# Patient Record
Sex: Male | Born: 1950 | Race: White | Hispanic: No | Marital: Married | Smoking: Never smoker
Health system: Southern US, Community
[De-identification: ages and names within clinical notes are randomized; demographics above are authoritative.]

## PROBLEM LIST (undated history)

## (undated) DIAGNOSIS — T7840XA Allergy, unspecified, initial encounter: Secondary | ICD-10-CM

## (undated) DIAGNOSIS — J189 Pneumonia, unspecified organism: Secondary | ICD-10-CM

## (undated) DIAGNOSIS — M722 Plantar fascial fibromatosis: Secondary | ICD-10-CM

## (undated) DIAGNOSIS — M1990 Unspecified osteoarthritis, unspecified site: Secondary | ICD-10-CM

## (undated) DIAGNOSIS — J309 Allergic rhinitis, unspecified: Secondary | ICD-10-CM

## (undated) DIAGNOSIS — K219 Gastro-esophageal reflux disease without esophagitis: Secondary | ICD-10-CM

## (undated) HISTORY — DX: Plantar fascial fibromatosis: M72.2

## (undated) HISTORY — DX: Allergy, unspecified, initial encounter: T78.40XA

## (undated) HISTORY — PX: APPENDECTOMY: SHX54

## (undated) HISTORY — PX: TONSILLECTOMY: SUR1361

## (undated) HISTORY — DX: Unspecified osteoarthritis, unspecified site: M19.90

## (undated) HISTORY — DX: Allergic rhinitis, unspecified: J30.9

---
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
## 2002-08-14 ENCOUNTER — Ambulatory Visit (HOSPITAL_COMMUNITY): Admission: RE | Admit: 2002-08-14 | Discharge: 2002-08-14 | Payer: Self-pay | Admitting: Gastroenterology

## 2006-05-08 ENCOUNTER — Encounter: Admission: RE | Admit: 2006-05-08 | Discharge: 2006-05-08 | Payer: Self-pay | Admitting: Sports Medicine

## 2010-02-20 ENCOUNTER — Emergency Department (HOSPITAL_COMMUNITY)
Admission: EM | Admit: 2010-02-20 | Discharge: 2010-02-20 | Payer: Self-pay | Source: Home / Self Care | Admitting: Emergency Medicine

## 2010-03-29 ENCOUNTER — Encounter: Payer: Self-pay | Admitting: Sports Medicine

## 2010-07-24 NOTE — Op Note (Signed)
   Maurice [name], Maurice [name]                              ACCOUNT NO.:  [id number]   MEDICAL RECORD NO.:  [id number]                   PATIENT TYPE:  AMB   LOCATION:  ENDO                                 FACILITY:  [facility]   PHYSICIAN:  John C. Madilyn Fireman, M.D.                 DATE OF BIRTH:  05-23-1950   DATE OF PROCEDURE:  08/14/2002  DATE OF DISCHARGE:                                 OPERATIVE REPORT   PROCEDURE:  Colonoscopy.   INDICATIONS FOR PROCEDURE:  Family history of colon polyps in a first-degree  relative.   DESCRIPTION OF PROCEDURE:  The patient was placed in the left lateral  decubitus position and placed on the pulse monitor with continuous low flow  oxygen delivered by nasal cannula.  He was sedated with 50 mcg IV fentanyl,  6 mg IV Versed.  The Olympus video colonoscope was inserted into the rectum  and advanced to the cecum, confirmed by transillumination of McBurney's  point and visualization of the ileocecal valve and appendiceal orifice.  Prep was excellent.  The cecum, ascending, transverse, descending, and  sigmoid colon all appeared normal with no masses, polyps, diverticula, or  other mucosal abnormalities.  The rectum likewise appeared normal and  retroflexed view of the anus revealed no obvious internal hemorrhoids.  The  colonoscope was then withdrawn and the patient returned to the recovery room  in stable condition.  He tolerated the procedure well and there were no  immediate complications.   IMPRESSION:  Normal colonoscopy.   PLAN:  Repeat study in five years.                                                John C. Madilyn Fireman, M.D.    JCH/MEDQ  D:  08/14/2002  T:  08/14/2002  Job:  [id number]

## 2012-09-22 ENCOUNTER — Ambulatory Visit (INDEPENDENT_AMBULATORY_CARE_PROVIDER_SITE_OTHER): Payer: BC Managed Care – PPO | Admitting: Family Medicine

## 2012-09-22 VITALS — BP 110/70 | HR 81 | Temp 98.5°F | Resp 18 | Ht 69.0 in | Wt 194.0 lb

## 2012-09-22 DIAGNOSIS — R05 Cough: Secondary | ICD-10-CM

## 2012-09-22 DIAGNOSIS — J209 Acute bronchitis, unspecified: Secondary | ICD-10-CM

## 2012-09-22 MED ORDER — HYDROCODONE-HOMATROPINE 5-1.5 MG/5ML PO SYRP: 5.0000 mL | ORAL_SOLUTION | Freq: Three times a day (TID) | ORAL | Status: DC | PRN

## 2012-09-22 MED ORDER — AZITHROMYCIN 250 MG PO TABS: ORAL_TABLET | ORAL | Status: DC

## 2012-09-22 NOTE — Progress Notes (Signed)
[facility] [address] [place] [address] [phone]  Date:  09/22/2012   Name:  Maurice [name]   DOB:  [birth year]   MRN:  [id number]  PCP:  No PCP Per Patient    Chief Complaint: chest cold with cough started sunday   History of Present Illness:  Maurice [name] is a 62 y.o. very pleasant male patient who presents with the following:   Patient presents today and says that he has a cold. He states that about 5 days ago he started with a sore throat. The next day he started having a runny nose and some post nasal drainage which has been persistent. He denies having any fever. A cough manifested last night. The cough is productive with green mucous. He denies sickness of anyone else in the household. Patient states that he was feeling run down and this initiated the onset of the cold. Patient mentions that he has a history of chronic rhinitis and pneumonia x3.   Denies headache or ear pain. Took some sudafed but this caused difficulty urinating.  He stopped the sudafed and this problem has resolved.   He would like to do a physical soon  There are no active problems to display for this patient.   Past Medical History  Diagnosis Date  . Allergy   . Arthritis     History reviewed. No pertinent past surgical history.  History  Substance Use Topics  . Smoking status: Never Smoker   . Smokeless tobacco: Not on file  . Alcohol Use: No    Family History  Problem Relation Age of Onset  . Arthritis Father   . Cancer Father     No Known Allergies  Medication list has been reviewed and updated.  No current outpatient prescriptions on file prior to visit.   No current facility-administered medications on file prior to visit.    Review of Systems:  As per HPI- otherwise negative. No fever.      Physical Examination: Filed Vitals:   09/22/12 1425  BP: 110/70  Pulse: 81  Temp: 98.5 F (36.9 C)  Resp: 18   Filed Vitals:   09/22/12 1425   Height: 5\' 9"  (1.753 m)  Weight: 194 lb (87.998 kg)   Body mass index is 28.64 kg/(m^2). Ideal Body Weight: Weight in (lb) to have BMI = 25: 168.9  GEN: WDWN, NAD, Non-toxic, A & O x 3, looks well HEENT: Atraumatic, Normocephalic. Neck supple. No masses, No LAD.  Bilateral TM wnl, oropharynx normal.  PEERL,EOMI.   Ears and Nose: No external deformity. CV: RRR, No M/G/R. No JVD. No thrill. No extra heart sounds. PULM: slight chest congestion but no wheezing. No retractions. No resp. distress. No accessory muscle use. ABD: S, NT, ND, +BS. No rebound. No HSM. EXTR: No c/c/e NEURO Normal gait.  PSYCH: Normally interactive. Conversant. Not depressed or anxious appearing.  Calm demeanor.    Assessment and Plan: Acute bronchitis - Plan: azithromycin (ZITHROMAX) 250 MG tablet  Cough - Plan: HYDROcodone-homatropine (HYCODAN) 5-1.5 MG/5ML syrup  Treat with azithromycin for bronchitis, and hycodan as needed for cough.  Let me know if not better in the next few days- Sooner if worse.     Signed Abbe Amsterdam, MD

## 2012-09-22 NOTE — Patient Instructions (Addendum)
Use the azithromycin antibiotic as directed, and the cough syrup as needed. Remember the cough syrup can make you sleepy, so do not use it when you need to drive.

## 2012-09-25 ENCOUNTER — Ambulatory Visit (INDEPENDENT_AMBULATORY_CARE_PROVIDER_SITE_OTHER): Payer: BC Managed Care – PPO | Admitting: Family Medicine

## 2012-09-25 ENCOUNTER — Ambulatory Visit: Payer: BC Managed Care – PPO

## 2012-09-25 VITALS — BP 110/70 | HR 70 | Temp 98.4°F | Resp 16 | Ht 69.0 in | Wt 194.0 lb

## 2012-09-25 DIAGNOSIS — M19039 Primary osteoarthritis, unspecified wrist: Secondary | ICD-10-CM

## 2012-09-25 DIAGNOSIS — M25532 Pain in left wrist: Secondary | ICD-10-CM

## 2012-09-25 DIAGNOSIS — M25539 Pain in unspecified wrist: Secondary | ICD-10-CM

## 2012-09-25 LAB — URIC ACID: Uric Acid, Serum: 5.7 mg/dL (ref 4.0–7.8)

## 2012-09-25 MED ORDER — DICLOFENAC SODIUM 75 MG PO TBEC: 75.0000 mg | DELAYED_RELEASE_TABLET | Freq: Two times a day (BID) | ORAL | Status: DC

## 2012-09-25 NOTE — Patient Instructions (Addendum)
Take Voltaren (diclofenac) one twice daily for pain and inflammation  Return if worse  If symptoms persist we can refer you to an orthopedist.

## 2012-09-25 NOTE — Progress Notes (Signed)
Subjective: Patient attached the coasters onto a chair a couple days ago. His wrist develop severe pain in it. He had a fracture of the navicular when he was young.  Objective: Very painful and limited range of motion left wrist. He is left-handed.  Assessment: Left wrist pain  Plan: X-ray  [facility] reading (PRIMARY) by  Dr. Alwyn Ren Arthritic joint  Uric acid  If the uric acid is high Will treated for gout. Did go ahead and give him) for the arthritic wrists. May have to refer to an orthopedist if symptoms persist..

## 2015-09-17 ENCOUNTER — Other Ambulatory Visit: Payer: Self-pay

## 2015-10-02 ENCOUNTER — Other Ambulatory Visit: Payer: Self-pay | Admitting: Allergy and Immunology

## 2015-10-02 NOTE — Telephone Encounter (Addendum)
Pt called and made appointment for aug 22 and needs [place] to call in rx for montelukast and fluticasone , astelin.  [place]

## 2015-10-03 ENCOUNTER — Other Ambulatory Visit: Payer: Self-pay

## 2015-10-03 MED ORDER — MONTELUKAST SODIUM 10 MG PO TABS: 10.0000 mg | ORAL_TABLET | Freq: Every day | ORAL | 0 refills | Status: DC

## 2015-10-03 MED ORDER — AZELASTINE HCL 0.1 % NA SOLN: 1.0000 | Freq: Two times a day (BID) | NASAL | 0 refills | Status: DC

## 2015-10-03 MED ORDER — FLUTICASONE PROPIONATE 50 MCG/ACT NA SUSP: 1.0000 | Freq: Every day | NASAL | 0 refills | Status: DC

## 2015-10-03 NOTE — Telephone Encounter (Signed)
Sent script into [facility]. Patient informed.

## 2015-10-28 ENCOUNTER — Ambulatory Visit (INDEPENDENT_AMBULATORY_CARE_PROVIDER_SITE_OTHER): Payer: Federal, State, Local not specified - PPO | Admitting: Allergy and Immunology

## 2015-10-28 ENCOUNTER — Encounter: Payer: Self-pay | Admitting: Allergy and Immunology

## 2015-10-28 ENCOUNTER — Encounter (INDEPENDENT_AMBULATORY_CARE_PROVIDER_SITE_OTHER): Payer: Self-pay

## 2015-10-28 VITALS — BP 136/82 | HR 64 | Resp 16 | Ht 67.3 in | Wt 190.0 lb

## 2015-10-28 DIAGNOSIS — J310 Chronic rhinitis: Secondary | ICD-10-CM | POA: Diagnosis not present

## 2015-10-28 DIAGNOSIS — J302 Other seasonal allergic rhinitis: Secondary | ICD-10-CM

## 2015-10-28 DIAGNOSIS — H1045 Other chronic allergic conjunctivitis: Secondary | ICD-10-CM | POA: Diagnosis not present

## 2015-10-28 DIAGNOSIS — T485X5A Adverse effect of other anti-common-cold drugs, initial encounter: Secondary | ICD-10-CM

## 2015-10-28 DIAGNOSIS — H1010 Acute atopic conjunctivitis, unspecified eye: Secondary | ICD-10-CM

## 2015-10-28 DIAGNOSIS — J309 Allergic rhinitis, unspecified: Principal | ICD-10-CM

## 2015-10-28 NOTE — Patient Instructions (Addendum)
  1. Continue Flonase one spray each nostril twice a day  2. Continue Astelin one spray each nostril twice a day  3. Continue montelukast 10 mg daily  4. Use least amount of Afrin possible  5. Obtain fall flu vaccine  6. Return to clinic in 1 year or earlier if problem 

## 2015-10-28 NOTE — Progress Notes (Signed)
Follow-up Note  Referring Provider: No ref. provider found Primary Provider: Anthoney Harada, MD Date of Office Visit: 10/28/2015  Subjective:   Maurice [name] (DOB: May 25, 1950) is a 65 y.o. male who returns to the [facility] on 10/28/2015 in re-evaluation of the following:  HPI: Maurice [name] returns to this clinic in reevaluation of his allergic rhinoconjunctivitis and rhinitis medicamentosa. I've not seen him in this clinic in approximately one year.  During the interval he has done very well utilizing a plan of fluticasone and Astelin and montelukast and Afrin. He will use Afrin about 3 times a week usually only at nighttime. He does not appear to develop any type of rebound phenomena but he does need to use Afrin a few times a week just to keep his nose open while he sleeps. There is no real rhyme or reason why his nose gets congested on some nights and does not get congested on others. Overall he is very pleased with the response that he has received utilizing this therapy.    Medication List      azelastine 0.1 % nasal spray Commonly known as:  ASTELIN Place 1 spray into both nostrils 2 (two) times daily.   fluticasone 50 MCG/ACT nasal spray Commonly known as:  FLONASE Place 1-2 sprays into both nostrils daily.   montelukast 10 MG tablet Commonly known as:  SINGULAIR Take 1 tablet (10 mg total) by mouth at bedtime.   oxymetazoline 0.05 % nasal spray Commonly known as:  AFRIN Place 2 sprays into the nose 2 (two) times daily.       Past Medical History:  Diagnosis Date  . Allergic rhinitis   . Allergy   . Arthritis     Past Surgical History:  Procedure Laterality Date  . APPENDECTOMY    . TONSILLECTOMY      No Known Allergies  Review of systems negative except as noted in HPI / PMHx or noted below:  Review of Systems  Constitutional: Negative.   HENT: Negative.   Eyes: Negative.   Respiratory: Negative.   Cardiovascular: Negative.     Gastrointestinal: Negative.   Genitourinary: Negative.   Musculoskeletal: Negative.   Skin: Negative.   Neurological: Negative.   Endo/Heme/Allergies: Negative.   Psychiatric/Behavioral: Negative.      Objective:   Vitals:   10/28/15 1344  BP: 136/82  Pulse: 64  Resp: 16   Height: 5' 7.3" (170.9 cm)  Weight: 190 lb (86.2 kg)   Physical Exam  Constitutional: He is well-developed, well-nourished, and in no distress.  HENT:  Head: Normocephalic.  Right Ear: Tympanic membrane, external ear and ear canal normal.  Left Ear: Tympanic membrane, external ear and ear canal normal.  Nose: Nose normal. No mucosal edema or rhinorrhea.  Mouth/Throat: Uvula is midline, oropharynx is clear and moist and mucous membranes are normal. No oropharyngeal exudate.  Eyes: Conjunctivae are normal.  Neck: Trachea normal. No tracheal tenderness present. No tracheal deviation present. No thyromegaly present.  Cardiovascular: Normal rate, regular rhythm, S1 normal, S2 normal and normal heart sounds.   No murmur heard. Pulmonary/Chest: Breath sounds normal. No stridor. No respiratory distress. He has no wheezes. He has no rales.  Musculoskeletal: He exhibits no edema.  Lymphadenopathy:       Head (right side): No tonsillar adenopathy present.       Head (left side): No tonsillar adenopathy present.    He has no cervical adenopathy.  Neurological: He is alert. Gait normal.  Skin:  No rash noted. He is not diaphoretic. No erythema. Nails show no clubbing.  Psychiatric: Mood and affect normal.    Diagnostics: None   Assessment and Plan:   1. Allergic rhinoconjunctivitis   2. Rhinitis medicamentosa     1. Continue Flonase one spray each nostril twice a day  2. Continue Astelin one spray each nostril twice a day  3. Continue montelukast 10 mg daily  4. Use least amount of Afrin possible  5. Obtain fall flu vaccine  6. Return to clinic in 1 year or earlier if problem  Maurice [name] is doing quite  well on his current medical plan and I see no need for him changing this plan at this point in time. He still continues to use Afrin a few times per week but as long as he also uses nasal fluticasone I think he will do relatively well and we'll not get into the whole issue of rebound phenomena. I'll see him back in this clinic in approximately one year or earlier if there is a problem.  Allena Katz, MD [place]

## 2015-11-18 ENCOUNTER — Other Ambulatory Visit: Payer: Self-pay | Admitting: Allergy and Immunology

## 2016-03-24 ENCOUNTER — Encounter (HOSPITAL_COMMUNITY): Payer: Self-pay

## 2016-03-24 ENCOUNTER — Emergency Department (HOSPITAL_COMMUNITY)
Admission: EM | Admit: 2016-03-24 | Discharge: 2016-03-24 | Disposition: A | Discharge status: Home / Self Care | Payer: Federal, State, Local not specified - PPO | Attending: Emergency Medicine | Admitting: Emergency Medicine

## 2016-03-24 DIAGNOSIS — K644 Residual hemorrhoidal skin tags: Secondary | ICD-10-CM | POA: Diagnosis not present

## 2016-03-24 DIAGNOSIS — K6289 Other specified diseases of anus and rectum: Secondary | ICD-10-CM | POA: Diagnosis present

## 2016-03-24 DIAGNOSIS — Z79899 Other long term (current) drug therapy: Secondary | ICD-10-CM | POA: Diagnosis not present

## 2016-03-24 LAB — CBC WITH DIFFERENTIAL/PLATELET
BASOS ABS: 0 10*3/uL (ref 0.0–0.1)
BASOS PCT: 0 %
EOS ABS: 0.3 10*3/uL (ref 0.0–0.7)
EOS PCT: 3 %
HCT: 46.1 % (ref 39.0–52.0)
HEMOGLOBIN: 16 g/dL (ref 13.0–17.0)
Lymphocytes Relative: 18 %
Lymphs Abs: 1.7 10*3/uL (ref 0.7–4.0)
MCH: 31.9 pg (ref 26.0–34.0)
MCHC: 34.7 g/dL (ref 30.0–36.0)
MCV: 91.8 fL (ref 78.0–100.0)
Monocytes Absolute: 1 10*3/uL (ref 0.1–1.0)
Monocytes Relative: 10 %
NEUTROS PCT: 69 %
Neutro Abs: 6.4 10*3/uL (ref 1.7–7.7)
PLATELETS: 236 10*3/uL (ref 150–400)
RBC: 5.02 MIL/uL (ref 4.22–5.81)
RDW: 12.7 % (ref 11.5–15.5)
WBC: 9.4 10*3/uL (ref 4.0–10.5)

## 2016-03-24 LAB — BASIC METABOLIC PANEL
Anion gap: 10 (ref 5–15)
BUN: 20 mg/dL (ref 6–20)
CALCIUM: 9 mg/dL (ref 8.9–10.3)
CHLORIDE: 104 mmol/L (ref 101–111)
CO2: 22 mmol/L (ref 22–32)
CREATININE: 0.88 mg/dL (ref 0.61–1.24)
Glucose, Bld: 101 mg/dL — ABNORMAL HIGH (ref 65–99)
Potassium: 4.1 mmol/L (ref 3.5–5.1)
SODIUM: 136 mmol/L (ref 135–145)

## 2016-03-24 MED ORDER — HYDROCODONE-ACETAMINOPHEN 5-325 MG PO TABS: 1.0000 | ORAL_TABLET | ORAL | 0 refills | Status: DC | PRN

## 2016-03-24 MED ORDER — BENZONATATE 100 MG PO CAPS
100.0000 mg | ORAL_CAPSULE | Freq: Once | ORAL | Status: AC
  Administered 2016-03-24: 100 mg via ORAL
  Filled 2016-03-24: qty 1

## 2016-03-24 MED ORDER — IOPAMIDOL (ISOVUE-300) INJECTION 61%
INTRAVENOUS | Status: AC
  Administered 2016-03-24: 30 mL via ORAL
  Filled 2016-03-24: qty 30

## 2016-03-24 MED ORDER — POLYETHYLENE GLYCOL 3350 17 G PO PACK: 17.0000 g | PACK | Freq: Three times a day (TID) | ORAL | 0 refills | Status: DC

## 2016-03-24 MED ORDER — IOPAMIDOL (ISOVUE-300) INJECTION 61%
30.0000 mL | Freq: Once | INTRAVENOUS | Status: AC | PRN
  Administered 2016-03-24: 30 mL via ORAL

## 2016-03-24 NOTE — ED Triage Notes (Signed)
Pt has prolapsed rectum. Was seen by Md yesterday.  Pt states pain is getting worse.  Told by MD to come here for surgical eval.

## 2016-03-24 NOTE — ED Notes (Signed)
Unable to collect labs at this time xray is in the room

## 2016-03-24 NOTE — ED Provider Notes (Signed)
[facility] DEPT Provider Note   CSN: [id number] Arrival date & time: 03/24/16  [id number]     History   Chief Complaint Chief Complaint  Patient presents with  . Rectal Pain    HPI Maurice [name] is a 66 y.o. male.  HPI   Patient presents with rectal pain and hemorrhoid prolapse x 3 days after straining with constipation.  Was sent by PCP concerned for possible prolopsed rectum, thrombosed hemorrhoids, or rectal abscess.  Was seen by PCP yesterday and referred to general surgery but surgery office is closed today.  Yesterday was also diagnosed with bronchitis.  Prescribed anusol, nupericain, z-pak, chertussin. Had difficulty using suppository due to the large amount of swelling around his rectum.  Denies fevers, abdominal pain, change in bowel habits.    Past Medical History:  Diagnosis Date  . Allergic rhinitis   . Allergy   . Arthritis     There are no active problems to display for this patient.   Past Surgical History:  Procedure Laterality Date  . APPENDECTOMY    . TONSILLECTOMY         Home Medications    Prior to Admission medications   Medication Sig Start Date End Date Taking? Authorizing Provider  acetaminophen (TYLENOL) 500 MG tablet Take 1,000 mg by mouth every 6 (six) hours as needed for mild pain, moderate pain, fever or headache.   Yes Historical Provider, MD  azelastine (ASTELIN) 0.1 % nasal spray USE 1 SPRAY IN EACH NOSTRIL TWICE DAILY 11/18/15  Yes Jiles Prows, MD  azithromycin (ZITHROMAX) 250 MG tablet Take 250-500 mg by mouth daily. Started 01/16 for 5 days Take 500mg  on day 1, then 250mg  daily for 4 days   Yes Historical Provider, MD  dibucaine (NUPERCAINAL) 1 % ointment Apply 1 application topically 3 (three) times daily as needed for pain.   Yes Historical Provider, MD  fluticasone (FLONASE) 50 MCG/ACT nasal spray SHAKE LIQUID AND USE 1 TO 2 SPRAYS IN EACH NOSTRIL DAILY 11/18/15  Yes Jiles Prows, MD  guaiFENesin-codeine [facility]) 100-10  MG/5ML syrup Take 5 mLs by mouth every 6 (six) hours as needed for cough.   Yes Historical Provider, MD  hydrocortisone (ANUSOL-HC) 25 MG suppository Place 25 mg rectally 2 (two) times daily as needed for hemorrhoids or itching.   Yes Historical Provider, MD  montelukast (SINGULAIR) 10 MG tablet TAKE 1 TABLET(10 MG) BY MOUTH AT BEDTIME 11/18/15  Yes Jiles Prows, MD  oxymetazoline (AFRIN) 0.05 % nasal spray Place 3 sprays into the nose 2 (two) times daily as needed for congestion.    Yes Historical Provider, MD  tadalafil (CIALIS) 20 MG tablet Take 20 mg by mouth daily as needed for erectile dysfunction.   Yes Historical Provider, MD  HYDROcodone-acetaminophen (NORCO/VICODIN) 5-325 MG tablet Take 1-2 tablets by mouth every 4 (four) hours as needed for moderate pain or severe pain. 03/24/16   Clayton Bibles, PA-C  polyethylene glycol (MIRALAX / GLYCOLAX) packet Take 17 g by mouth 3 (three) times daily. 03/24/16   Clayton Bibles, PA-C    Family History Family History  Problem Relation Age of Onset  . Arthritis Father   . Cancer Father   . Allergic rhinitis Father   . Asthma Father     Social History Social History  Substance Use Topics  . Smoking status: Never Smoker  . Smokeless tobacco: Never Used  . Alcohol use No     Allergies   Patient has no known allergies.  Review of Systems Review of Systems  All other systems reviewed and are negative.    Physical Exam Updated Vital Signs BP 140/86 (BP Location: Left Arm)   Pulse 71   Temp 98.2 F (36.8 C) (Oral)   Resp 16   SpO2 99%   Physical Exam  Constitutional: He appears well-developed and well-nourished. No distress.  HENT:  Head: Normocephalic and atraumatic.  Neck: Neck supple.  Cardiovascular: Normal rate and regular rhythm.   Pulmonary/Chest: Effort normal and breath sounds normal. No respiratory distress. He has no wheezes. He has no rales.  Abdominal: Soft. He exhibits no distension and no mass. There is no tenderness.  There is no rebound and no guarding.  Genitourinary:  Genitourinary Comments: Two large protruding mucosal covered masses, tender to palpation.  No bleeding.  Unable to perform full rectal exam secondary to pain and large protruding masses.    Neurological: He is alert. He exhibits normal muscle tone.  Skin: He is not diaphoretic.  Nursing note and vitals reviewed.    ED Treatments / Results  Labs (all labs ordered are listed, but only abnormal results are displayed) Labs Reviewed  BASIC METABOLIC PANEL - Abnormal; Notable for the following:       Result Value   Glucose, Bld 101 (*)    All other components within normal limits  CBC WITH DIFFERENTIAL/PLATELET    EKG  EKG Interpretation None       Radiology No results found.  Procedures Procedures (including critical care time)  Medications Ordered in ED Medications  benzonatate (TESSALON) capsule 100 mg (100 mg Oral Given 03/24/16 1247)  iopamidol (ISOVUE-300) 61 % injection 30 mL (30 mLs Oral Contrast Given 03/24/16 1132)     Initial Impression / Assessment and Plan / ED Course  I have reviewed the triage vital signs and the nursing notes.  Pertinent labs & imaging results that were available during my care of the patient were reviewed by me and considered in my medical decision making (see chart for details).  Clinical Course as of Mar 24 1314  Wed Mar 24, 2016  1249 Patient also seen and examined by Dr Eulis Foster who diagnoses external hemorrhoids.  Will cancel CT but continue bloodwork.  Plan for d/c home with Miralax TID, norco, sitz baths.    [EW]    Clinical Course User Index [EW] Clayton Bibles, PA-C   Afebrile, nontoxic patient with painful external hemorrhoid.  Hemorrhoids are not thrombosed.  No e/o rectal prolapse.  Doubt rectal abscess.   D/C home with symptomatic treatment, surgical referral.  Discussed result, findings, treatment, and follow up  with patient.  Pt given return precautions.  Pt verbalizes  understanding and agrees with plan.       Final Clinical Impressions(s) / ED Diagnoses   Final diagnoses:  External hemorrhoid    New Prescriptions New Prescriptions   HYDROCODONE-ACETAMINOPHEN (NORCO/VICODIN) 5-325 MG TABLET    Take 1-2 tablets by mouth every 4 (four) hours as needed for moderate pain or severe pain.   POLYETHYLENE GLYCOL (MIRALAX / GLYCOLAX) PACKET    Take 17 g by mouth 3 (three) times daily.     Clayton Bibles, PA-C 03/24/16 [place], MD 03/25/16 [place], MD 03/25/16 2223

## 2016-03-24 NOTE — ED Provider Notes (Signed)
  Face-to-face evaluation   History: Patient is here for painful bumps on the anus, history same. Seen by PCP who advised that he might have prolapsed rectum or prolapsed internal hemorrhoids. He is using hydrocortisone suppositories with partial relief. He denies abdominal pain, weakness or dizziness.  Physical exam: Anus Exam- 2 relatively large posterior aspect external hemorrhoids which are partially reducible with light pressure. These do not appear thrombosed. There is no clinical evidence for prolapsed rectum.  -Medical decision-making- painful hemorrhoids, with appearance of external anatomy. No indication for incision, hospitalization or urgent surgical repair.  Medical screening examination/treatment/procedure(s) were conducted as a shared visit with non-physician practitioner(s) and myself.  I personally evaluated the patient during the encounter   Daleen Bo, MD 03/25/16 2224

## 2016-03-24 NOTE — Discharge Instructions (Signed)
Read the information below.  Use the prescribed medication as directed.  Please discuss all new medications with your pharmacist.  You may return to the Emergency Department at any time for worsening condition or any new symptoms that concern you.    If you develop fevers, uncontrolled pain, inability to pass gas or defecate, please return to the Emergency Department for a recheck.

## 2016-03-30 ENCOUNTER — Other Ambulatory Visit: Payer: Self-pay | Admitting: Surgery

## 2016-05-11 ENCOUNTER — Telehealth: Payer: Self-pay

## 2016-05-11 NOTE — Telephone Encounter (Signed)
NOTES SENT TO SCHEDULING.  °

## 2016-05-27 DIAGNOSIS — R002 Palpitations: Secondary | ICD-10-CM | POA: Insufficient documentation

## 2016-05-31 ENCOUNTER — Ambulatory Visit (INDEPENDENT_AMBULATORY_CARE_PROVIDER_SITE_OTHER): Payer: Federal, State, Local not specified - PPO

## 2016-05-31 DIAGNOSIS — R002 Palpitations: Secondary | ICD-10-CM

## 2016-12-07 ENCOUNTER — Ambulatory Visit (INDEPENDENT_AMBULATORY_CARE_PROVIDER_SITE_OTHER): Payer: Federal, State, Local not specified - PPO

## 2016-12-07 ENCOUNTER — Ambulatory Visit (INDEPENDENT_AMBULATORY_CARE_PROVIDER_SITE_OTHER): Payer: Federal, State, Local not specified - PPO | Admitting: Podiatry

## 2016-12-07 ENCOUNTER — Encounter: Payer: Self-pay | Admitting: Podiatry

## 2016-12-07 VITALS — BP 117/82 | HR 70 | Resp 16

## 2016-12-07 DIAGNOSIS — M2012 Hallux valgus (acquired), left foot: Secondary | ICD-10-CM

## 2016-12-07 DIAGNOSIS — M722 Plantar fascial fibromatosis: Secondary | ICD-10-CM | POA: Diagnosis not present

## 2016-12-07 MED ORDER — METHYLPREDNISOLONE 4 MG PO TBPK: ORAL_TABLET | ORAL | 0 refills | Status: DC

## 2016-12-07 MED ORDER — MELOXICAM 15 MG PO TABS: 15.0000 mg | ORAL_TABLET | Freq: Every day | ORAL | 3 refills | Status: DC

## 2016-12-07 NOTE — Patient Instructions (Signed)

## 2016-12-07 NOTE — Progress Notes (Signed)
  Subjective:  Patient ID: Maurice [name], male    DOB: [birth year],  MRN: [id number] HPI Chief Complaint  Patient presents with  . Foot Pain    Plantar heel and lateral left - aching x 6 weeks, AM pain, no treatment    66 y.o. male presents with the above complaint.     Past Medical History:  Diagnosis Date  . Allergic rhinitis   . Allergy   . Arthritis    Past Surgical History:  Procedure Laterality Date  . APPENDECTOMY    . TONSILLECTOMY      Current Outpatient Prescriptions:  .  azelastine (ASTELIN) 0.1 % nasal spray, USE 1 SPRAY IN EACH NOSTRIL TWICE DAILY, Disp: 30 mL, Rfl: 9 .  fluticasone (FLONASE) 50 MCG/ACT nasal spray, SHAKE LIQUID AND USE 1 TO 2 SPRAYS IN EACH NOSTRIL DAILY, Disp: 16 g, Rfl: 9 .  montelukast (SINGULAIR) 10 MG tablet, TAKE 1 TABLET(10 MG) BY MOUTH AT BEDTIME, Disp: 30 tablet, Rfl: 9 .  tadalafil (CIALIS) 20 MG tablet, Take 20 mg by mouth daily as needed for erectile dysfunction., Disp: , Rfl:   No Known Allergies Review of Systems  Musculoskeletal: Positive for arthralgias.  All other systems reviewed and are negative.  Objective:   Vitals:   12/07/16 0912  BP: 117/82  Pulse: 70  Resp: 16    General: Well developed, nourished, in no acute distress, alert and oriented x3   Dermatological: Skin is warm, dry and supple bilateral. Nails x 10 are well maintained; remaining integument appears unremarkable at this time. There are no open sores, no preulcerative lesions, no rash or signs of infection present.  Vascular: Dorsalis Pedis artery and Posterior Tibial artery pedal pulses are 2/4 bilateral with immedate capillary fill time. Pedal hair growth present. No varicosities and no lower extremity edema present bilateral.   Neruologic: Grossly intact via light touch bilateral. Vibratory intact via tuning fork bilateral. Protective threshold with Semmes Wienstein monofilament intact to all pedal sites bilateral. Patellar and Achilles deep tendon  reflexes 2+ bilateral. No Babinski or clonus noted bilateral.   Musculoskeletal: No gross boney pedal deformities bilateral. No pain, crepitus, or limitation noted with foot and ankle range of motion bilateral. Muscular strength 5/5 in all groups tested bilateral. Pain on palpation medial calcaneal tubercle of the left heel. No pain on medial and lateral compression of the calcaneus. No reproducible pain to the plantar lateral aspect of the foot.  Gait: Unassisted, Nonantalgic.    Radiographs:  3 views left foot demonstrates osseously mature individual. Soft tissue increasing density at the plantar fascial calcaneal insertion site. No spurs noted.  Assessment & Plan:   Assessment: Plantar fasciitis left.  Plan: We discussed the etiology pathology conservative versus surgical therapies. At this point we started a Medrol Dosepak to be followed by meloxicam. Injected his left heel placed in the plantar fascial brace and a night splint. Discussed appropriate shoe gear stretching exercises ice therapy and shoe gear modifications. I will follow-up with him in 1 month.     Max T. Dibble, [place]

## 2016-12-08 ENCOUNTER — Telehealth: Payer: Self-pay | Admitting: *Deleted

## 2016-12-08 NOTE — Telephone Encounter (Signed)
Pt presented to office stating he is unable to put on plantar fascial brace. Pt states he has been putting the broad band around his arch. I instructed pt to place the broad band with the "sun" at the back of the ankle with the cutout arch over the heel, and snuggle fasten the 2 velcro straps to the band, then place the elastic velcro strap on the outer side of the broad band and pull beneath the arch and attach to the medial side of the broad band. I instructed the pt that he could move the strap forward or backward, tighten or loosen for his comfort, that if it as comfortable it was doing it's job. Pt states understanding.

## 2016-12-13 ENCOUNTER — Other Ambulatory Visit: Payer: Self-pay | Admitting: Allergy and Immunology

## 2016-12-21 ENCOUNTER — Ambulatory Visit (INDEPENDENT_AMBULATORY_CARE_PROVIDER_SITE_OTHER): Payer: Federal, State, Local not specified - PPO | Admitting: Allergy and Immunology

## 2016-12-21 ENCOUNTER — Encounter: Payer: Self-pay | Admitting: Allergy and Immunology

## 2016-12-21 VITALS — BP 110/74 | HR 68 | Resp 16

## 2016-12-21 DIAGNOSIS — J310 Chronic rhinitis: Secondary | ICD-10-CM

## 2016-12-21 DIAGNOSIS — T485X5A Adverse effect of other anti-common-cold drugs, initial encounter: Secondary | ICD-10-CM

## 2016-12-21 DIAGNOSIS — T485X1A Poisoning by other anti-common-cold drugs, accidental (unintentional), initial encounter: Secondary | ICD-10-CM

## 2016-12-21 DIAGNOSIS — J3089 Other allergic rhinitis: Secondary | ICD-10-CM

## 2016-12-21 MED ORDER — FLUTICASONE PROPIONATE 50 MCG/ACT NA SUSP: NASAL | 11 refills | Status: DC

## 2016-12-21 MED ORDER — AZELASTINE HCL 0.1 % NA SOLN: NASAL | 11 refills | Status: DC

## 2016-12-21 MED ORDER — MONTELUKAST SODIUM 10 MG PO TABS: ORAL_TABLET | ORAL | 11 refills | Status: DC

## 2016-12-21 NOTE — Progress Notes (Signed)
Follow-up Note  Referring Provider: Vernie Shanks, MD Primary Provider: Vernie Shanks, MD Date of Office Visit: 12/21/2016  Subjective:   Maurice [name] (DOB: 16-Feb-1951) is a 66 y.o. male who returns to the [facility] [place] on 12/21/2016 in re-evaluation of the following:  HPI: Maurice [name] returns to this clinic in reevaluation of his allergic rhinoconjunctivitis and rhinitis medicamentosa. His last visit to this clinic was August 2017.  He has really done very well as long as he continues to use his medical plan which includes Flonase and Astelin and montelukast. He still continues to use Afrin most nights but does not develop a rebound phenomenon during the daytime as long as he continues on his 3 medications. Unfortunately, for the past 3-4 weeks he has been out of these medications and has developed significant nasal congestion throughout the day. He has not required a systemic steroid or an antibiotic to treat any type of respiratory tract issue since I have last seen him in this clinic.  Allergies as of 12/21/2016   No Known Allergies     Medication List      oxymetazoline 0.05 % nasal spray Commonly known as:  AFRIN Place 1 spray into both nostrils as needed for congestion.   SILDENAFIL CITRATE PO Take by mouth as needed.       Past Medical History:  Diagnosis Date  . Allergic rhinitis   . Allergy   . Arthritis   . Plantar fasciitis, left     Past Surgical History:  Procedure Laterality Date  . APPENDECTOMY    . TONSILLECTOMY      Review of systems negative except as noted in HPI / PMHx or noted below:  Review of Systems  Constitutional: Negative.   HENT: Negative.   Eyes: Negative.   Respiratory: Negative.   Cardiovascular: Negative.   Gastrointestinal: Negative.   Genitourinary: Negative.   Musculoskeletal: Negative.   Skin: Negative.   Neurological: Negative.   Endo/Heme/Allergies: Negative.   Psychiatric/Behavioral: Negative.       Objective:   Vitals:   12/21/16 1026  BP: 110/74  Pulse: 68  Resp: 16          Physical Exam  Constitutional: He is well-developed, well-nourished, and in no distress.  HENT:  Head: Normocephalic.  Right Ear: Tympanic membrane, external ear and ear canal normal.  Left Ear: Tympanic membrane, external ear and ear canal normal.  Nose: Nose normal. No mucosal edema or rhinorrhea.  Mouth/Throat: Uvula is midline, oropharynx is clear and moist and mucous membranes are normal. No oropharyngeal exudate.  Eyes: Conjunctivae are normal.  Neck: Trachea normal. No tracheal tenderness present. No tracheal deviation present. No thyromegaly present.  Cardiovascular: Normal rate, regular rhythm, S1 normal, S2 normal and normal heart sounds.   No murmur heard. Pulmonary/Chest: Breath sounds normal. No stridor. No respiratory distress. He has no wheezes. He has no rales.  Musculoskeletal: He exhibits no edema.  Lymphadenopathy:       Head (right side): No tonsillar adenopathy present.       Head (left side): No tonsillar adenopathy present.    He has no cervical adenopathy.  Neurological: He is alert. Gait normal.  Skin: No rash noted. He is not diaphoretic. No erythema. Nails show no clubbing.  Psychiatric: Mood and affect normal.    Diagnostics: none  Assessment and Plan:   1. Other allergic rhinitis   2. Rhinitis medicamentosa     1. Continue Flonase one spray each  nostril twice a day  2. Continue Astelin one spray each nostril twice a day  3. Continue montelukast 10 mg daily  4. Use least amount of Afrin possible  5. Obtain fall flu vaccine  6. Return to clinic in 1 year or earlier if problem  Maurice [name] appears to be doing relatively well as long as he continues to use his medications and I have refilled his Flonase and Astelin and montelukast and I once again asked him to use the least amount of Afrin necessary to keep his nose open. He has a very good understanding about  his medications and about his Afrin use. I will see him back in this clinic in 1 year or earlier if there is a problem.  Allena Katz, MD Allergy / Immunology [place]

## 2016-12-21 NOTE — Patient Instructions (Signed)
  1. Continue Flonase one spray each nostril twice a day  2. Continue Astelin one spray each nostril twice a day  3. Continue montelukast 10 mg daily  4. Use least amount of Afrin possible  5. Obtain fall flu vaccine  6. Return to clinic in 1 year or earlier if problem 

## 2017-01-04 ENCOUNTER — Ambulatory Visit: Payer: Federal, State, Local not specified - PPO | Admitting: Podiatry

## 2017-02-15 ENCOUNTER — Ambulatory Visit: Payer: Federal, State, Local not specified - PPO | Admitting: Podiatry

## 2017-02-17 ENCOUNTER — Ambulatory Visit: Payer: Federal, State, Local not specified - PPO | Admitting: Podiatry

## 2017-11-04 ENCOUNTER — Telehealth: Payer: Self-pay | Admitting: Allergy and Immunology

## 2017-11-04 ENCOUNTER — Other Ambulatory Visit: Payer: Self-pay

## 2017-11-04 MED ORDER — FLUTICASONE PROPIONATE 50 MCG/ACT NA SUSP: NASAL | 0 refills | Status: DC

## 2017-11-04 MED ORDER — MONTELUKAST SODIUM 10 MG PO TABS: ORAL_TABLET | ORAL | 0 refills | Status: DC

## 2017-11-04 MED ORDER — AZELASTINE HCL 0.1 % NA SOLN: NASAL | 0 refills | Status: DC

## 2017-11-04 NOTE — Telephone Encounter (Signed)
Sent in refill on astlin, flonase, and singulair

## 2017-11-04 NOTE — Telephone Encounter (Signed)
Pt called and made appointment for 12/06/2017 and needs singulair and astlin and flonase called into [facility] [phone].

## 2017-12-06 ENCOUNTER — Encounter: Payer: Self-pay | Admitting: Allergy and Immunology

## 2017-12-06 ENCOUNTER — Ambulatory Visit: Payer: Federal, State, Local not specified - PPO | Admitting: Allergy and Immunology

## 2017-12-06 VITALS — BP 116/72 | HR 64 | Resp 16 | Ht 69.0 in | Wt 180.0 lb

## 2017-12-06 DIAGNOSIS — T485X5A Adverse effect of other anti-common-cold drugs, initial encounter: Secondary | ICD-10-CM

## 2017-12-06 DIAGNOSIS — J3089 Other allergic rhinitis: Secondary | ICD-10-CM | POA: Diagnosis not present

## 2017-12-06 DIAGNOSIS — J310 Chronic rhinitis: Secondary | ICD-10-CM

## 2017-12-06 MED ORDER — MONTELUKAST SODIUM 10 MG PO TABS: ORAL_TABLET | ORAL | 11 refills | Status: DC

## 2017-12-06 MED ORDER — FLUTICASONE PROPIONATE 50 MCG/ACT NA SUSP: NASAL | 11 refills | Status: DC

## 2017-12-06 MED ORDER — AZELASTINE HCL 0.1 % NA SOLN: NASAL | 11 refills | Status: DC

## 2017-12-06 NOTE — Patient Instructions (Signed)
  1. Continue Flonase one spray each nostril twice a day  2. Continue Astelin one spray each nostril twice a day  3. Continue montelukast 10 mg daily  4. Use least amount of Afrin possible  5. Obtain fall flu vaccine  6. Return to clinic in 1 year or earlier if problem

## 2017-12-06 NOTE — Progress Notes (Signed)
Follow-up Note  Referring Provider: Vernie Shanks, MD Primary Provider: Vernie Shanks, MD Date of Office Visit: 12/06/2017  Subjective:   Maurice [name] (DOB: 22-Jun-1950) is a 67 y.o. male who returns to the [facility] [place] on 12/06/2017 in re-evaluation of the following:  HPI: Maurice [name] returns to this clinic in reevaluation of allergic rhinoconjunctivitis and history of rhinitis medicamentosa.  His last visit to this clinic was 21 December 2016.  Once again, while consistently using a combination of Flonase and Astelin and montelukast he has had excellent control of his upper airway issue and his requirement for Afrin is about 1 time every other night at this point.  He has not required a systemic steroid or antibiotic to treat any type of respiratory tract issue.  Allergies as of 12/06/2017   No Known Allergies     Medication List      azelastine 0.1 % nasal spray Commonly known as:  ASTELIN Use one spray in each nostril twice daily as directed.   fluticasone 50 MCG/ACT nasal spray Commonly known as:  FLONASE Use one spray in each nostril twice daily as directed.   montelukast 10 MG tablet Commonly known as:  SINGULAIR TAKE 1 TABLET(10 MG) BY MOUTH AT BEDTIME   oxymetazoline 0.05 % nasal spray Commonly known as:  AFRIN Place 1 spray into both nostrils as needed for congestion.   SILDENAFIL CITRATE PO Take by mouth as needed.       Past Medical History:  Diagnosis Date  . Allergic rhinitis   . Allergy   . Arthritis   . Plantar fasciitis, left     Past Surgical History:  Procedure Laterality Date  . APPENDECTOMY    . TONSILLECTOMY      Review of systems negative except as noted in HPI / PMHx or noted below:  Review of Systems  Constitutional: Negative.   HENT: Negative.   Eyes: Negative.   Respiratory: Negative.   Cardiovascular: Negative.   Gastrointestinal: Negative.   Genitourinary: Negative.   Musculoskeletal: Negative.   Skin:  Negative.   Neurological: Negative.   Endo/Heme/Allergies: Negative.   Psychiatric/Behavioral: Negative.      Objective:   Vitals:   12/06/17 1609  BP: 116/72  Pulse: 64  Resp: 16   Height: 5\' 9"  (175.3 cm)  Weight: 180 lb (81.6 kg)   Physical Exam  HENT:  Head: Normocephalic.  Right Ear: Tympanic membrane, external ear and ear canal normal.  Left Ear: Tympanic membrane, external ear and ear canal normal.  Nose: Nose normal. No mucosal edema or rhinorrhea.  Mouth/Throat: Uvula is midline, oropharynx is clear and moist and mucous membranes are normal. No oropharyngeal exudate.  Eyes: Conjunctivae are normal.  Neck: Trachea normal. No tracheal tenderness present. No tracheal deviation present. No thyromegaly present.  Cardiovascular: Normal rate, regular rhythm, S1 normal, S2 normal and normal heart sounds.  No murmur heard. Pulmonary/Chest: Breath sounds normal. No stridor. No respiratory distress. He has no wheezes. He has no rales.  Musculoskeletal: He exhibits no edema.  Lymphadenopathy:       Head (right side): No tonsillar adenopathy present.       Head (left side): No tonsillar adenopathy present.    He has no cervical adenopathy.  Neurological: He is alert.  Skin: No rash noted. He is not diaphoretic. No erythema. Nails show no clubbing.    Diagnostics: none  Assessment and Plan:   1. Perennial allergic rhinitis   2. Rhinitis medicamentosa  1. Continue Flonase one spray each nostril twice a day  2. Continue Astelin one spray each nostril twice a day  3. Continue montelukast 10 mg daily  4. Use least amount of Afrin possible  5. Obtain fall flu vaccine  6. Return to clinic in 1 year or earlier if problem  Maurice [name] is doing very well on his current plan.  I do not think he is going to get into any problems with rebound from using Afrin every other night as long as he continues to use a combination of his other medications on a consistent basis.  I will see  him back in this clinic in 1 year or earlier if there is a problem.  Allena Katz, MD Allergy / Immunology [place]

## 2017-12-07 ENCOUNTER — Encounter: Payer: Self-pay | Admitting: Allergy and Immunology

## 2018-03-20 ENCOUNTER — Other Ambulatory Visit: Payer: Self-pay | Admitting: Family Medicine

## 2018-03-20 DIAGNOSIS — K429 Umbilical hernia without obstruction or gangrene: Secondary | ICD-10-CM

## 2018-03-27 ENCOUNTER — Ambulatory Visit
Admission: RE | Admit: 2018-03-27 | Discharge: 2018-03-27 | Disposition: A | Discharge status: Home / Self Care | Payer: Federal, State, Local not specified - PPO | Source: Ambulatory Visit | Attending: Family Medicine | Admitting: Family Medicine

## 2018-03-27 DIAGNOSIS — K429 Umbilical hernia without obstruction or gangrene: Secondary | ICD-10-CM

## 2018-05-03 ENCOUNTER — Encounter: Payer: Self-pay | Admitting: Allergy

## 2018-05-03 ENCOUNTER — Ambulatory Visit: Payer: Federal, State, Local not specified - PPO | Admitting: Allergy

## 2018-05-03 VITALS — BP 130/86 | HR 72 | Temp 97.7°F | Resp 16 | Ht 69.0 in | Wt 185.8 lb

## 2018-05-03 DIAGNOSIS — R0981 Nasal congestion: Secondary | ICD-10-CM | POA: Diagnosis not present

## 2018-05-03 MED ORDER — FLUTICASONE PROPIONATE 93 MCG/ACT NA EXHU: 2.0000 | INHALANT_SUSPENSION | Freq: Two times a day (BID) | NASAL | 5 refills | Status: DC

## 2018-05-03 NOTE — Progress Notes (Signed)
Follow Up Note  RE: Maurice [name] MRN: [id number] DOB: October 27, 1950 Date of Office Visit: 05/03/2018  Referring provider: Vernie Shanks, MD Primary care provider: Vernie Shanks, MD  Chief Complaint: Allergic Rhinitis   History of Present Illness: I had the pleasure of seeing Maurice [name] for a follow up visit at the [facility] [place] [facility] [place] on 05/03/2018. He is a 68 y.o. male, who is being followed for allergic rhinitis. Today he is here for new complaint of persistent symptoms. His previous allergy office visit was on 12/06/2017 with Dr. Neldon Mc.   Patient had issues with sinusitis for years. In 2014 patient was started on Singulair, Astelin and Flonase. He has tried to wean off Afrin but still has to use every night as he wakes up around 3AM with a stuffy nose. He does have a dog at home which is allowed on the bed.   Denies any rhinorrhea, sneezing. Skin testing in 2015 was positive to some molds. Patient was never on allergy injections. No recent ENT evaluation. No previous sinus surgery. Denies reflux symptoms.  Currently on Astelin 1-2 sprays twice a day and Flonase 1 spray twice a day and Singulair daily.  Assessment and Plan: Maurice [name] is a 68 y.o. male with: Nasal congestion Persistent nasal congestion which worsens at night despite using Astelin, Flonase and Singulair. Only Afrin seems to help these symptoms. 2015 skin testing was positive to some molds. No recent ENT evaluation or previous sinus surgery.   Discussed repeating skin testing as it was done over 4 years ago and patient declines at this time.   Start Xhance 2 spray twice a day. This replaces Flonase. Demonstrated proper use.   Continue Astelin 1-2 sprays each nostril twice a day as needed for runny nose.   Continue montelukast 10 mg daily.  Use least amount of Afrin as possible.  Follow up in 2 months. If not feeling better then we will repeat skin testing and/or refer to ENT for further evaluation.   Return  in about 2 months (around 07/02/2018).  Meds ordered this encounter  Medications  . Fluticasone Propionate (XHANCE) 93 MCG/ACT EXHU    Sig: Place 2 sprays into the nose 2 (two) times daily.    Dispense:  16 mL    Refill:  5    [phone] (Preferred)   Diagnostics: Skin Testing: Declines.  Medication List:  Current Outpatient Medications  Medication Sig Dispense Refill  . azelastine (ASTELIN) 0.1 % nasal spray Use one spray in each nostril twice daily as directed. 30 mL 11  . montelukast (SINGULAIR) 10 MG tablet TAKE 1 TABLET(10 MG) BY MOUTH AT BEDTIME 30 tablet 11  . oxymetazoline (AFRIN) 0.05 % nasal spray Place 1 spray into both nostrils as needed for congestion.    Marland Kitchen SILDENAFIL CITRATE PO Take by mouth as needed.    . Fluticasone Propionate (XHANCE) 93 MCG/ACT EXHU Place 2 sprays into the nose 2 (two) times daily. 16 mL 5   No current facility-administered medications for this visit.    Allergies: No Known Allergies I reviewed his past medical history, social history, family history, and environmental history and no significant changes have been reported from previous visit on 12/06/2017.  Review of Systems  Constitutional: Negative for appetite change, chills, fever and unexpected weight change.  HENT: Positive for congestion. Negative for rhinorrhea.   Eyes: Negative for itching.  Respiratory: Negative for cough, chest tightness, shortness of breath and wheezing.   Gastrointestinal: Negative for abdominal  pain.  Skin: Negative for rash.  Neurological: Negative for headaches.   Objective: BP 130/86   Pulse 72   Temp 97.7 F (36.5 C) (Oral)   Resp 16   Ht 5\' 9"  (1.753 m)   Wt 185 lb 12.8 oz (84.3 kg)   SpO2 96%   BMI 27.44 kg/m  Body mass index is 27.44 kg/m. Physical Exam  Constitutional: He is oriented to person, place, and time. He appears well-developed and well-nourished.  HENT:  Head: Normocephalic and atraumatic.  Right Ear: External ear normal.  Left  Ear: External ear normal.  Nose: Nose normal.  Mouth/Throat: Oropharynx is clear and moist.  Eyes: Conjunctivae and EOM are normal.  Neck: Neck supple.  Cardiovascular: Normal rate, regular rhythm and normal heart sounds. Exam reveals no gallop and no friction rub.  No murmur heard. Pulmonary/Chest: Effort normal and breath sounds normal. He has no wheezes. He has no rales.  Neurological: He is alert and oriented to person, place, and time.  Skin: Skin is warm. No rash noted.  Psychiatric: He has a normal mood and affect. His behavior is normal.  Nursing note and vitals reviewed.  Previous notes and tests were reviewed. The plan was reviewed with the patient/family, and all questions/concerned were addressed.  It was my pleasure to see Maurice [name] today and participate in his care. Please feel free to contact me with any questions or concerns.  Sincerely,  Rexene Alberts, DO Allergy & Immunology  [facility] office: [phone] [facility] office: [phone]

## 2018-05-03 NOTE — Assessment & Plan Note (Signed)
Persistent nasal congestion which worsens at night despite using Astelin, Flonase and Singulair. Only Afrin seems to help these symptoms. 2015 skin testing was positive to some molds. No recent ENT evaluation or previous sinus surgery.   Discussed repeating skin testing as it was done over 4 years ago and patient declines at this time.   Start Xhance 2 spray twice a day. This replaces Flonase. Demonstrated proper use.   Continue Astelin 1-2 sprays each nostril twice a day as needed for runny nose.   Continue montelukast 10 mg daily.  Use least amount of Afrin as possible.  Follow up in 2 months. If not feeling better then we will repeat skin testing and/or refer to ENT for further evaluation.

## 2018-05-03 NOTE — Patient Instructions (Addendum)
1. Start Xhance 2 spray twice a day. This replaces Flonase.  2. Continue Astelin 1-2 sprays each nostril twice a day as needed for runny nose.   3. Continue montelukast 10 mg daily  4. Use least amount of Afrin as possible  5. Follow up in 2 months. If not feeling better then we will repeat skin testing and/or refer you to ENT.

## 2019-05-15 ENCOUNTER — Ambulatory Visit: Payer: Federal, State, Local not specified - PPO | Admitting: Allergy and Immunology

## 2019-05-24 ENCOUNTER — Ambulatory Visit: Payer: Federal, State, Local not specified - PPO | Admitting: Allergy

## 2019-05-24 NOTE — Progress Notes (Deleted)
Follow Up Note  RE: Maurice [name] MRN: [id number] DOB: 01/22/1951 Date of Office Visit: 05/24/2019  Referring provider: Vernie Shanks, MD Primary care provider: Vernie Shanks, MD  Chief Complaint: No chief complaint on file.  History of Present Illness: I had the pleasure of seeing Maurice [name] for a follow up visit at the [facility] [place] [facility] [place] on 05/24/2019. He is a 69 y.o. male, who is being followed for nasal congestion. His previous allergy office visit was on 05/03/2018 with Dr. Maudie Mercury. Today is a new complaint visit of nasal congestion.  Nasal congestion Persistent nasal congestion which worsens at night despite using Astelin, Flonase and Singulair. Only Afrin seems to help these symptoms. 2015 skin testing was positive to some molds. No recent ENT evaluation or previous sinus surgery.   Discussed repeating skin testing as it was done over 4 years ago and patient declines at this time.   Start Xhance 2 spray twice a day. This replaces Flonase. Demonstrated proper use.   Continue Astelin 1-2 sprays each nostril twice a day as needed for runny nose.   Continue montelukast 10 mg daily.  Use least amount of Afrin as possible.  Follow up in 2 months. If not feeling better then we will repeat skin testing and/or refer to ENT for further evaluation.   Return in about 2 months (around 07/02/2018).   Assessment and Plan: Maurice [name] is a 69 y.o. male with: No problem-specific Assessment & Plan notes found for this encounter.  No follow-ups on file.  No orders of the defined types were placed in this encounter.  Lab Orders  No laboratory test(s) ordered today    Diagnostics: Spirometry:  Tracings reviewed. His effort: {Blank single:19197::"Good reproducible efforts.","It was hard to get consistent efforts and there is a question as to whether this reflects a maximal maneuver.","Poor effort, data can not be interpreted."} FVC: ***L FEV1: ***L, ***% predicted FEV1/FVC  ratio: ***% Interpretation: {Blank single:19197::"Spirometry consistent with mild obstructive disease","Spirometry consistent with moderate obstructive disease","Spirometry consistent with severe obstructive disease","Spirometry consistent with possible restrictive disease","Spirometry consistent with mixed obstructive and restrictive disease","Spirometry uninterpretable due to technique","Spirometry consistent with normal pattern","No overt abnormalities noted given today's efforts"}.  Please see scanned spirometry results for details.  Skin Testing: {Blank single:19197::"Select foods","Environmental allergy panel","Environmental allergy panel and select foods","Food allergy panel","None","Deferred due to recent antihistamines use"}. Positive test to: ***. Negative test to: ***.  Results discussed with patient/family.   Medication List:  Current Outpatient Medications  Medication Sig Dispense Refill  . azelastine (ASTELIN) 0.1 % nasal spray Use one spray in each nostril twice daily as directed. 30 mL 11  . Fluticasone Propionate (XHANCE) 93 MCG/ACT EXHU Place 2 sprays into the nose 2 (two) times daily. 16 mL 5  . montelukast (SINGULAIR) 10 MG tablet TAKE 1 TABLET(10 MG) BY MOUTH AT BEDTIME 30 tablet 11  . oxymetazoline (AFRIN) 0.05 % nasal spray Place 1 spray into both nostrils as needed for congestion.    Marland Kitchen SILDENAFIL CITRATE PO Take by mouth as needed.     No current facility-administered medications for this visit.   Allergies: No Known Allergies I reviewed his past medical history, social history, family history, and environmental history and no significant changes have been reported from his previous visit.  Review of Systems  Constitutional: Negative for appetite change, chills, fever and unexpected weight change.  HENT: Positive for congestion. Negative for rhinorrhea.   Eyes: Negative for itching.  Respiratory: Negative for cough, chest tightness,  shortness of breath and  wheezing.   Gastrointestinal: Negative for abdominal pain.  Skin: Negative for rash.  Neurological: Negative for headaches.   Objective: There were no vitals taken for this visit. There is no height or weight on file to calculate BMI. Physical Exam  Constitutional: He is oriented to person, place, and time. He appears well-developed and well-nourished.  HENT:  Head: Normocephalic and atraumatic.  Right Ear: External ear normal.  Left Ear: External ear normal.  Nose: Nose normal.  Mouth/Throat: Oropharynx is clear and moist.  Eyes: Conjunctivae and EOM are normal.  Cardiovascular: Normal rate, regular rhythm and normal heart sounds. Exam reveals no gallop and no friction rub.  No murmur heard. Pulmonary/Chest: Effort normal and breath sounds normal. He has no wheezes. He has no rales.  Musculoskeletal:     Cervical back: Neck supple.  Neurological: He is alert and oriented to person, place, and time.  Skin: Skin is warm. No rash noted.  Psychiatric: He has a normal mood and affect. His behavior is normal.  Nursing note and vitals reviewed.  Previous notes and tests were reviewed. The plan was reviewed with the patient/family, and all questions/concerned were addressed.  It was my pleasure to see Maurice [name] today and participate in his care. Please feel free to contact me with any questions or concerns.  Sincerely,  Rexene Alberts, DO Allergy & Immunology  [facility] [place] office: [phone] [facility] office: [place] office: [phone]

## 2019-05-30 NOTE — Progress Notes (Signed)
Follow Up Note  RE: Maurice [name] MRN: [id number] DOB: [birth year] Date of Office Visit: 05/31/2019  Referring provider: Vernie Shanks, MD Primary care provider: Vernie Shanks, MD  Chief Complaint: Nasal Congestion (at night )  History of Present Illness: I had the pleasure of seeing Maurice [name] for a follow up visit at [facility] on 05/31/2019. He is a 69 y.o. male, who is being followed for nasal congestion. His previous allergy office visit was on 05/03/2018 with Dr. Maudie Mercury. Today is a new complaint of persistent nasal congestion.   Nasal congestion Patient stopped Astelin, montelukast, Flonase as it was not helping.   Patient had 2 tragic evens in his life since the past year and did not get to try [facility].  He was using Afrin once a night throughout the past year but then 3-4 weeks ago noted that it's only giving him about 3 hours of relief now and is very congested at night only.  He does have 1 cat and 1 dog at home.   Assessment and Plan: Maurice [name] is a 69 y.o. male with: Nasal congestion Past history - Persistent nasal congestion which worsens at night despite using Astelin, Flonase and Singulair. Only Afrin seems to help these symptoms. 2015 skin testing was positive to some molds. No recent ENT evaluation or previous sinus surgery.  Interim history - did not try Xhance. Afrin is not working as well anymore. Stopped Singulair, Astelin and Flonase. Symptoms worse at night.   Start Xhance 2 sprays per nostril twice a day.  Samples given. Demonstrated proper use.   If not helping in a few weeks then let us know and will send in prednisone as well.   Try to use the least amount of Afrin and eventually the goal is to stop.   Follow up in 2 months. If not feeling better then we will repeat skin testing and/or refer to ENT for further evaluation.   Return in about 2 months (around 07/31/2019).  Meds ordered this encounter  Medications  . Fluticasone Propionate  (XHANCE) 93 MCG/ACT EXHU    Sig: Place 2 sprays into the nose in the morning and at bedtime.    Dispense:  16 mL    Refill:  5    Please call [phone] for delivery.   Diagnostics: None.  Medication List:  Current Outpatient Medications  Medication Sig Dispense Refill  . SILDENAFIL CITRATE PO Take by mouth as needed.    . Fluticasone Propionate (XHANCE) 93 MCG/ACT EXHU Place 2 sprays into the nose in the morning and at bedtime. 16 mL 5   No current facility-administered medications for this visit.   Allergies: No Known Allergies I reviewed his past medical history, social history, family history, and environmental history and no significant changes have been reported from his previous visit.  Review of Systems  Constitutional: Negative for appetite change, chills, fever and unexpected weight change.  HENT: Positive for congestion. Negative for rhinorrhea.   Eyes: Negative for itching.  Respiratory: Negative for cough, chest tightness, shortness of breath and wheezing.   Gastrointestinal: Negative for abdominal pain.  Skin: Negative for rash.  Neurological: Negative for headaches.   Objective: BP 126/80 (BP Location: Left Arm, Patient Position: Sitting, Cuff Size: Normal)   Pulse 77   Temp 97.8 F (36.6 C) (Temporal)   Resp 18   SpO2 96%  There is no height or weight on file to calculate BMI. Physical Exam  Constitutional: He  is oriented to person, place, and time. He appears well-developed and well-nourished.  HENT:  Head: Normocephalic and atraumatic.  Right Ear: External ear normal.  Left Ear: External ear normal.  Nose: Nose normal.  Mouth/Throat: Oropharynx is clear and moist.  Eyes: Conjunctivae and EOM are normal.  Cardiovascular: Normal rate, regular rhythm and normal heart sounds. Exam reveals no gallop and no friction rub.  No murmur heard. Pulmonary/Chest: Effort normal and breath sounds normal. He has no wheezes. He has no rales.  Musculoskeletal:      Cervical back: Neck supple.  Neurological: He is alert and oriented to person, place, and time.  Skin: Skin is warm. No rash noted.  Psychiatric: He has a normal mood and affect. His behavior is normal.  Nursing note and vitals reviewed.  Previous notes and tests were reviewed. The plan was reviewed with the patient/family, and all questions/concerned were addressed.  It was my pleasure to see Maurice [name] today and participate in his care. Please feel free to contact me with any questions or concerns.  Sincerely,  Rexene Alberts, DO Allergy & Immunology  [facility] office: [phone] [facility] office: [place] office: [phone]

## 2019-05-31 ENCOUNTER — Other Ambulatory Visit: Payer: Self-pay

## 2019-05-31 ENCOUNTER — Ambulatory Visit: Payer: Federal, State, Local not specified - PPO | Admitting: Allergy

## 2019-05-31 ENCOUNTER — Encounter: Payer: Self-pay | Admitting: Allergy

## 2019-05-31 VITALS — BP 126/80 | HR 77 | Temp 97.8°F | Resp 18

## 2019-05-31 DIAGNOSIS — R0981 Nasal congestion: Secondary | ICD-10-CM

## 2019-05-31 MED ORDER — XHANCE 93 MCG/ACT NA EXHU: 2.0000 | INHALANT_SUSPENSION | Freq: Two times a day (BID) | NASAL | 5 refills | Status: DC

## 2019-05-31 NOTE — Assessment & Plan Note (Signed)
Past history - Persistent nasal congestion which worsens at night despite using Astelin, Flonase and Singulair. Only Afrin seems to help these symptoms. 2015 skin testing was positive to some molds. No recent ENT evaluation or previous sinus surgery.  Interim history - did not try Xhance. Afrin is not working as well anymore. Stopped Singulair, Astelin and Flonase. Symptoms worse at night.   Start Xhance 2 sprays per nostril twice a day.  Samples given. Demonstrated proper use.   If not helping in a few weeks then let us know and will send in prednisone as well.   Try to use the least amount of Afrin and eventually the goal is to stop.   Follow up in 2 months. If not feeling better then we will repeat skin testing and/or refer to ENT for further evaluation.

## 2019-05-31 NOTE — Patient Instructions (Addendum)
Nasal congestion  Start Xhance 2 sprays per nostril twice a day.  Samples given. Demonstrated proper use.   If not helping in a few weeks then let us know and will send in prednisone as well.   Try to use the least amount of Afrin and eventually the goal is to stop.   Follow up in 2 months. If not feeling better then we will repeat skin testing and/or refer to ENT for further evaluation.   Follow up in 2 months or sooner if needed.

## 2019-06-12 ENCOUNTER — Telehealth: Payer: Self-pay

## 2019-06-12 NOTE — Telephone Encounter (Signed)
Denied by insurance. Information has been sent to [employer] to request an appeal.

## 2019-06-12 NOTE — Telephone Encounter (Signed)
Prior authorization for Maurice [name] has been submitted to covermymeds. Pending approval/denial.

## 2019-06-19 NOTE — Telephone Encounter (Signed)
Appeal letter and supporting information sent to insurance for review.

## 2019-06-20 NOTE — Telephone Encounter (Signed)
Appeal denied. Patient can receive Xhance for 50.00 per fill as long as they do the auto-refill program. I left a detailed message on the patient's self identified voicemail advising him of this information.

## 2019-06-21 NOTE — Telephone Encounter (Signed)
I explained to the patient why the insurance had denied the appeal. He verbalized understanding and will contact the pharmacy once he makes a final decision on the auto-refill program. I let him know that he could contact the insurance on his own if he would wish and to give [place] a call if we could do anything thing else to assist.

## 2019-06-21 NOTE — Telephone Encounter (Signed)
Patient is not satisfy with the auto-refill and would like [place] to call [place] @ [phone] to reconsider the appeal and get it at a cheaper price. Please advise

## 2019-08-02 ENCOUNTER — Ambulatory Visit: Payer: Federal, State, Local not specified - PPO | Admitting: Allergy

## 2020-06-24 IMAGING — US US ABDOMEN LIMITED
1 series · 14 of 25 positions shown · non-contrast
Comparison: None.

CLINICAL DATA: Evaluate for possible umbilical hernia as palpable
lump just right of umbilicus.

EXAM:
ULTRASOUND ABDOMEN LIMITED

[Series 1: us abdomen limited · 0.06mm/px · 26 acquisitions, 14 frames shown]
[im 1/26]
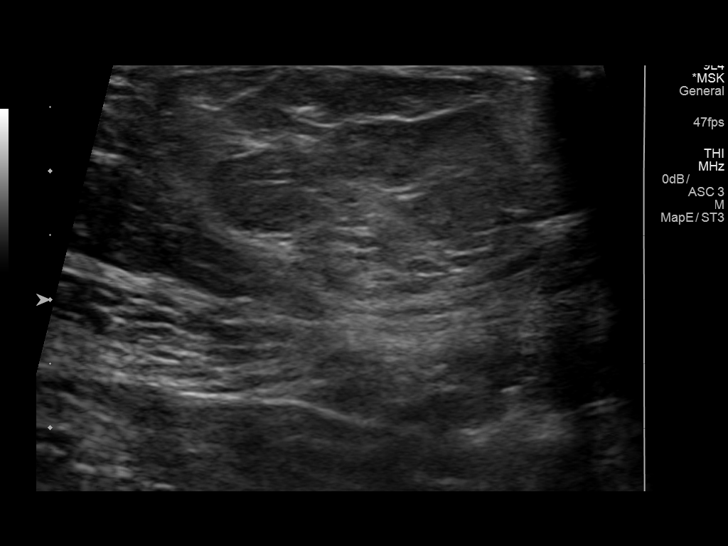
[im 3/26]
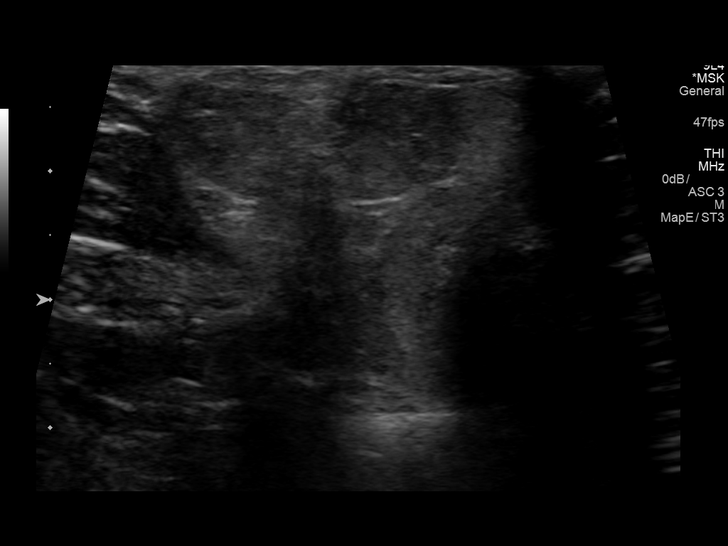
[im 5/26]
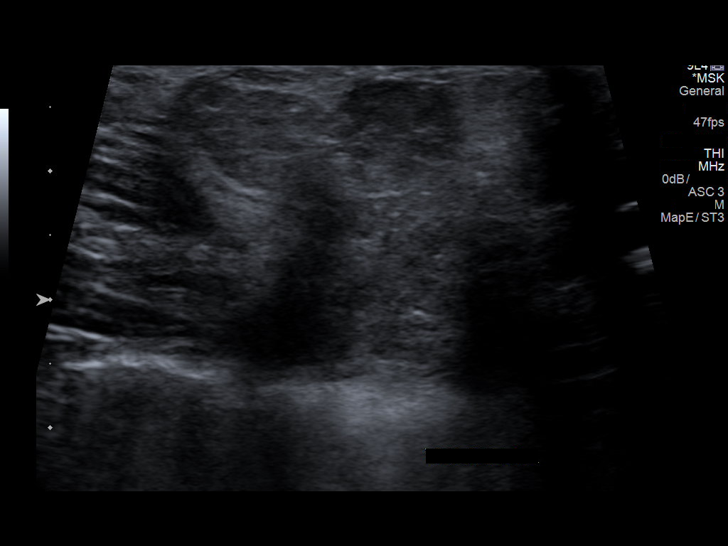
[im 7/26]
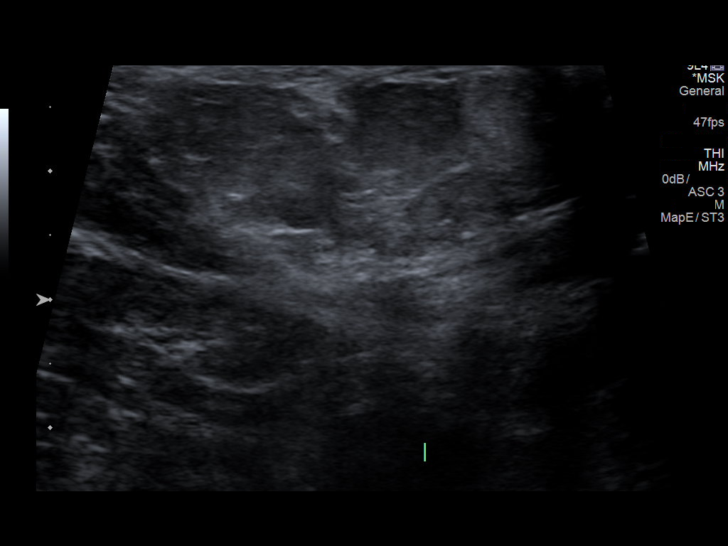
[im 9/26]
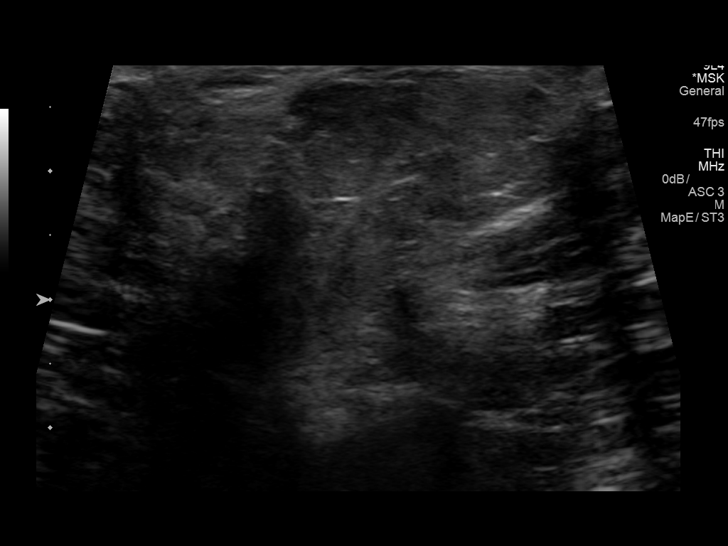
[im 10/26]
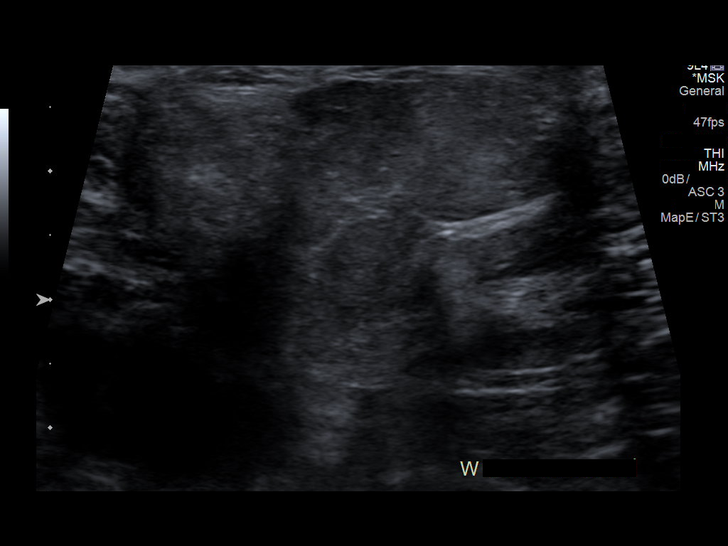
[im 12/26]
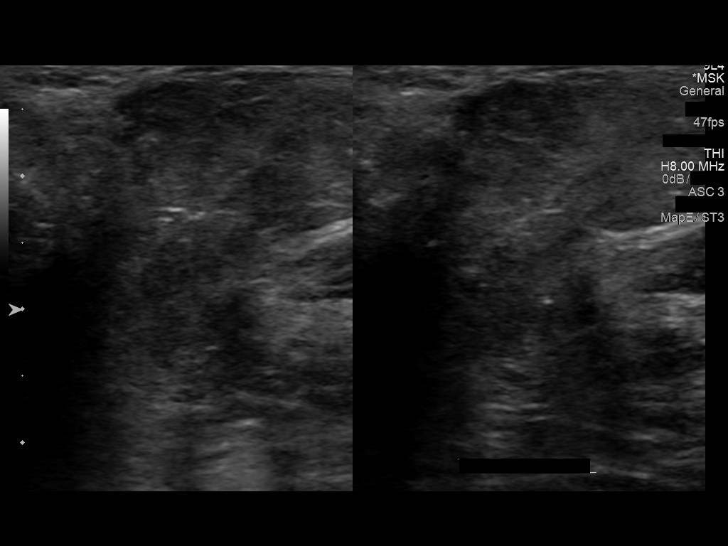
[im 14/26]
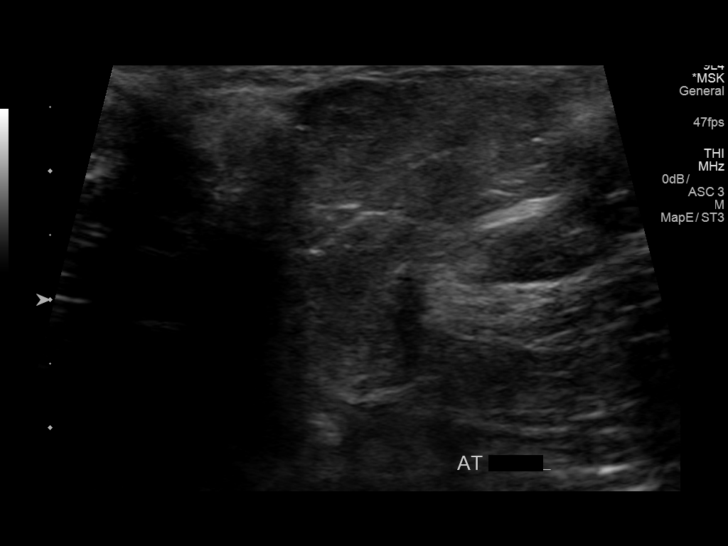
[im 16/26]
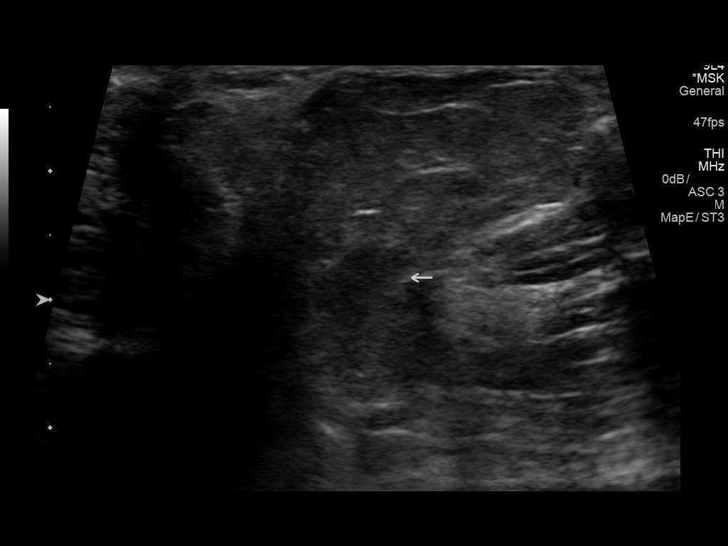
[im 17/26]
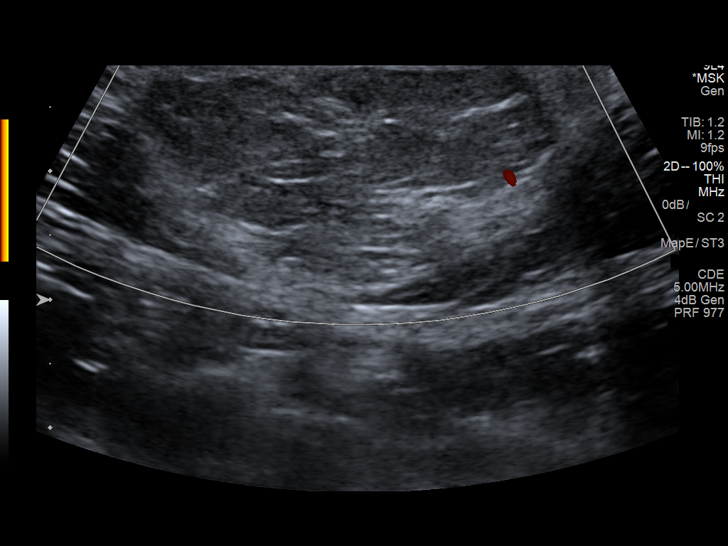
[im 19/26]
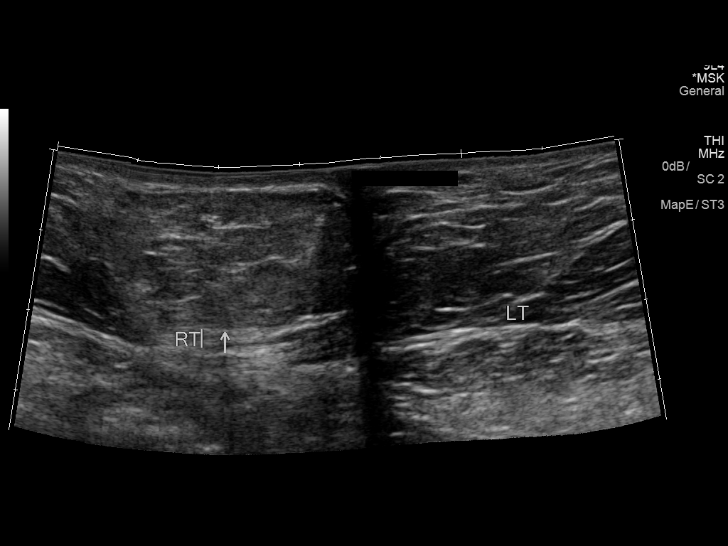
[im 21/26]
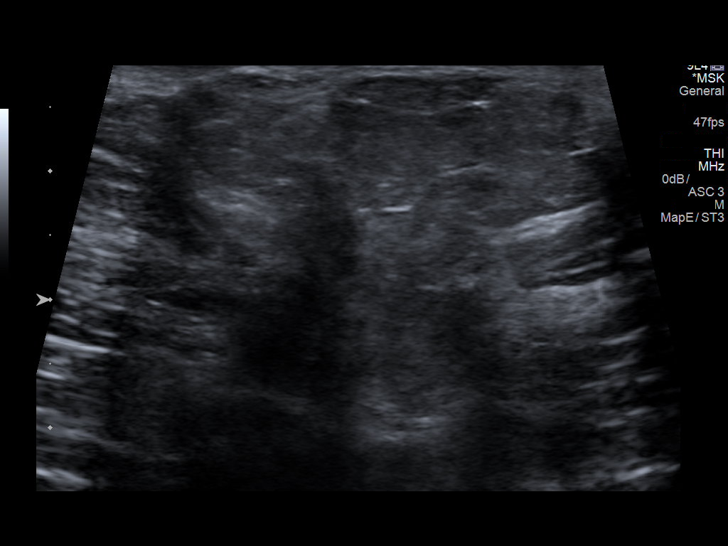
[im 23/26]
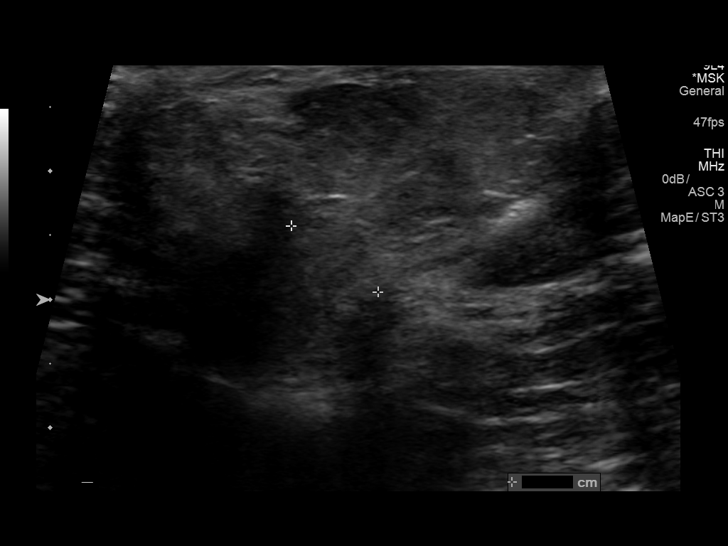
[im 26/26]
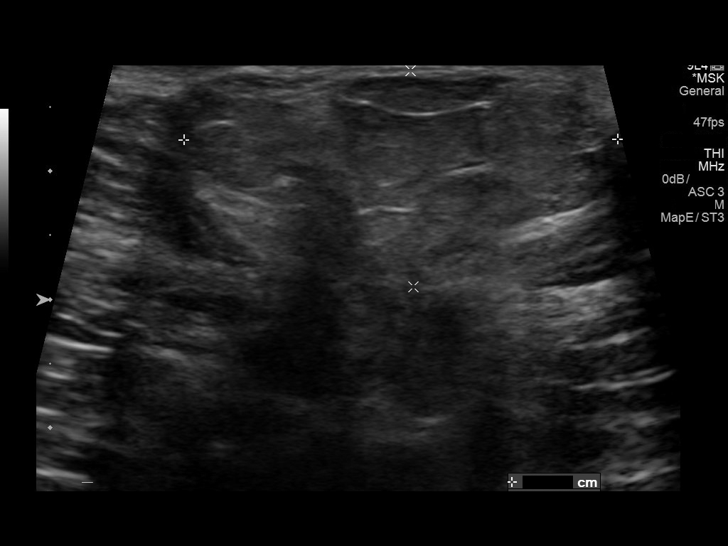

[14 of 25 positions shown; findings below may reference images not displayed]

FINDINGS: Examination of the abdominal wall was performed in the periumbilical
region with attention to the area of clinical concern just right of
the umbilicus. Examination demonstrates a ventral hernia just to the
right of the umbilicus as the neck of the fascial defect measures
approximately 9 mm. The hernia sac within the right anterior
abdominal wall is heterogeneous likely containing peritoneal fat as
no definite bowel loops are visualized. The herniated contents
measure approximately 3.4 x 1.7 x 3.4 cm. No significant fluid
within the hernia sac. No significant change in appearance of this
ventral hernia with compression or Valsalva.
IMPRESSION: Ventral hernia just to the right of midline at the level of the
umbilicus likely containing peritoneal fat as no definite bowel
loops are visualized. Neck of hernia 9 mm as hernia sac measures
approximately 3.4 x 1.7 x 3.4 cm without change on compression or
Valsalva.

## 2021-07-07 ENCOUNTER — Ambulatory Visit: Payer: Federal, State, Local not specified - PPO | Admitting: Orthopaedic Surgery

## 2022-01-04 NOTE — Progress Notes (Signed)
GU Location of Tumor / Histology:  Prostate Ca  If Prostate Cancer, Gleason Score is (3 + 3) and PSA is (4.82 on 11/10/2021)  Biopsies       Past/Anticipated interventions by urology, if any:  NA  Past/Anticipated interventions by medical oncology, if any: NA  Weight changes, if any: {:18581}  IPSS: SHIM:  Bowel/Bladder complaints, if any: {:18581}   Nausea/Vomiting, if any: {:18581}  Pain issues, if any:  {:18581}  SAFETY ISSUES: Prior radiation? {:18581} Pacemaker/ICD? {:18581} Possible current pregnancy? Male Is the patient on methotrexate? No  Current Complaints / other details:

## 2022-01-06 ENCOUNTER — Ambulatory Visit
Admission: RE | Admit: 2022-01-06 | Discharge: 2022-01-06 | Disposition: A | Discharge status: Home / Self Care | Payer: Federal, State, Local not specified - PPO | Source: Ambulatory Visit | Attending: Radiation Oncology | Admitting: Radiation Oncology

## 2022-01-06 VITALS — BP 154/98 | HR 70 | Temp 97.1°F | Resp 20 | Ht 69.0 in | Wt 186.0 lb

## 2022-01-06 DIAGNOSIS — Z923 Personal history of irradiation: Secondary | ICD-10-CM | POA: Insufficient documentation

## 2022-01-06 DIAGNOSIS — C61 Malignant neoplasm of prostate: Secondary | ICD-10-CM | POA: Insufficient documentation

## 2022-01-06 DIAGNOSIS — Z809 Family history of malignant neoplasm, unspecified: Secondary | ICD-10-CM | POA: Insufficient documentation

## 2022-01-06 DIAGNOSIS — Z79899 Other long term (current) drug therapy: Secondary | ICD-10-CM | POA: Diagnosis not present

## 2022-01-06 NOTE — Progress Notes (Signed)
Radiation Oncology         [phone] ________________________________  Initial Outpatient Consultation  Name: Maurice [name] MRN: [id number]  Date: 01/06/2022  DOB: December 28, 1950  CC: Quinn Axe, MD   REFERRING PHYSICIAN: Alexis Frock, MD  DIAGNOSIS: 71 y.o. gentleman with Stage T1c adenocarcinoma of the prostate with Gleason score of 3+4, and PSA of 4.82.    ICD-10-CM   1. Malignant neoplasm of prostate ([place])  C61       HISTORY OF PRESENT ILLNESS: Maurice [name] is a 71 y.o. male with a diagnosis of prostate cancer. He is an established urology patient with a history of BPH, fluctuating PSA and gross hematuria secondary to a large, friable prostate on cystoscopy with negative hematuria work-up otherwise with Dr. Tresa Moore a few years ago.  More recently, he was noted to have an elevated PSA of 5.08 on routine labs with his PCP in 04/2021 prompting a follow up visit with urology in 05/2021. At that time, his DRE remained normal so, after thorough discussion regarding options for evaluation, the decision was made to continue monitoring the PSA with repeat labs in 6 months. His repeat PSA in 11/2021 remained elevated at 4.82 so the patient proceeded to transrectal ultrasound with 12 biopsies of the prostate on 12/14/21.  The prostate volume measured 44 cc.  Out of 12 core biopsies, 4 were positive.  The maximum Gleason score was 3+4, and this was seen in the right apex. Additionally, there were 3 cores on the left that contained Gleason 3+3.  The patient reviewed the biopsy results with his urologist and he has kindly been referred today for discussion of potential radiation treatment options.   PREVIOUS RADIATION THERAPY: No  PAST MEDICAL HISTORY:  Past Medical History:  Diagnosis Date   Allergic rhinitis    Allergy    Arthritis    Plantar fasciitis, left       PAST SURGICAL HISTORY: Past Surgical History:  Procedure Laterality Date   APPENDECTOMY     TONSILLECTOMY       FAMILY HISTORY:  Family History  Problem Relation Age of Onset   Arthritis Father    Cancer Father    Allergic rhinitis Father    Asthma Father     SOCIAL HISTORY: He is a retired Secretary/administrator and remains very active, enjoys running. Social History   Socioeconomic History   Marital status: Married    Spouse name: Not on file   Number of children: Not on file   Years of education: Not on file   Highest education level: Not on file  Occupational History   Not on file  Tobacco Use   Smoking status: Never   Smokeless tobacco: Never  Vaping Use   Vaping Use: Never used  Substance and Sexual Activity   Alcohol use: No   Drug use: No   Sexual activity: Not on file  Other Topics Concern   Not on file  Social History Narrative   Not on file   Social Determinants of Health   Financial Resource Strain: Not on file  Food Insecurity: Not on file  Transportation Needs: Not on file  Physical Activity: Not on file  Stress: Not on file  Social Connections: Not on file  Intimate Partner Violence: Not on file    ALLERGIES: Patient has no known allergies.  MEDICATIONS:  Current Outpatient Medications  Medication Sig Dispense Refill   Fluticasone Propionate (XHANCE) 93 MCG/ACT EXHU Place 2 sprays into the nose  in the morning and at bedtime. 16 mL 5   SILDENAFIL CITRATE PO Take by mouth as needed.     No current facility-administered medications for this visit.    REVIEW OF SYSTEMS:  On review of systems, the patient reports that he is doing well overall. He denies any chest pain, shortness of breath, cough, fevers, chills, night sweats, unintended weight changes. He denies any bowel disturbances, and denies abdominal pain, nausea or vomiting. He denies any new musculoskeletal or joint aches or pains. His IPSS was 5, indicating mild urinary symptoms with daytime frequency only. His SHIM was 12, indicating he has moderate erectile dysfunction. A complete review of systems is  obtained and is otherwise negative.   PHYSICAL EXAM:  Wt Readings from Last 3 Encounters:  05/03/18 185 lb 12.8 oz (84.3 kg)  12/06/17 180 lb (81.6 kg)  10/28/15 190 lb (86.2 kg)   Temp Readings from Last 3 Encounters:  05/31/19 97.8 F (36.6 C) (Temporal)  05/03/18 97.7 F (36.5 C) (Oral)  03/24/16 98.2 F (36.8 C) (Oral)   BP Readings from Last 3 Encounters:  05/31/19 126/80  05/03/18 130/86  12/06/17 116/72   Pulse Readings from Last 3 Encounters:  05/31/19 77  05/03/18 72  12/06/17 64    /10  In general this is a well appearing Caucasian male in no acute distress. He's alert and oriented x4 and appropriate throughout the examination. Cardiopulmonary assessment is negative for acute distress, and he exhibits normal effort.     KPS = 100  100 - Normal; no complaints; no evidence of disease. 90   - Able to carry on normal activity; minor signs or symptoms of disease. 80   - Normal activity with effort; some signs or symptoms of disease. 14   - Cares for self; unable to carry on normal activity or to do active work. 60   - Requires occasional assistance, but is able to care for most of his personal needs. 50   - Requires considerable assistance and frequent medical care. 17   - Disabled; requires special care and assistance. 79   - Severely disabled; hospital admission is indicated although death not imminent. 79   - Very sick; hospital admission necessary; active supportive treatment necessary. 10   - Moribund; fatal processes progressing rapidly. 0     - Dead  Karnofsky DA, Abelmann Mason, Craver LS and Burchenal [facility] [phone]) The use of the nitrogen mustards in the palliative treatment of carcinoma: with particular reference to bronchogenic carcinoma Cancer 1 634-56  LABORATORY DATA:  Lab Results  Component Value Date   WBC 9.4 03/24/2016   HGB 16.0 03/24/2016   HCT 46.1 03/24/2016   MCV 91.8 03/24/2016   PLT 236 03/24/2016   Lab Results  Component Value Date    NA 136 03/24/2016   K 4.1 03/24/2016   CL 104 03/24/2016   CO2 22 03/24/2016   No results found for: "ALT", "AST", "GGT", "ALKPHOS", "BILITOT"   RADIOGRAPHY: No results found.    IMPRESSION/PLAN: 1. 71 y.o. gentleman with Stage T1c adenocarcinoma of the prostate with Gleason Score of 3+4, and PSA of 4.82. We discussed the patient's workup and outlined the nature of prostate cancer in this setting. The patient's T stage, Gleason's score, and PSA put him into the favorable intermediate risk group. Accordingly, he is eligible for a variety of potential treatment options including active surveillance, brachytherapy, 5.5 weeks of external radiation, or prostatectomy. We discussed the available radiation techniques, and  focused on the details and logistics of delivery. We discussed and outlined the risks, benefits, short and long-term effects associated with radiotherapy and compared and contrasted these with prostatectomy. We discussed the role of SpaceOAR gel in reducing the rectal toxicity associated with radiotherapy. He appears to have a good understanding of his disease and our treatment recommendations which are of curative intent.  He was encouraged to ask questions that were answered to his stated satisfaction.  At the conclusion of our conversation, the patient is undecided regarding his treatment preference, deciding between active surveillance or brachytherapy.  He will take some additional time to consider his options and has our contact information to let us know if he ultimately decides to proceed with radiation and we will move forward with treatment planning accordingly at that time.  We will share our discussion with Dr. Tresa Moore so that he can arrange for appropriate follow-up in the urology office as well.  We enjoyed meeting him today and look forward to continuing to participate in his care.  We personally spent 70 minutes in this encounter including chart review, reviewing radiological  studies, meeting face-to-face with the patient, entering orders and completing documentation.    Nicholos Johns, PA-C    Tyler Pita, MD  [facility] Direct Dial: [phone]  Fax: [phone] .com  Skype  LinkedIn

## 2022-01-18 ENCOUNTER — Telehealth: Payer: Self-pay | Admitting: *Deleted

## 2022-01-18 NOTE — Telephone Encounter (Signed)
RETURNED PATIENT'S PHONE CALL, SPOKE WITH PATIENT. ?

## 2022-02-02 ENCOUNTER — Telehealth: Payer: Self-pay | Admitting: *Deleted

## 2022-02-02 NOTE — Telephone Encounter (Signed)
Called patient to inform of implant date, lvm for a return call 

## 2022-02-05 ENCOUNTER — Encounter (HOSPITAL_BASED_OUTPATIENT_CLINIC_OR_DEPARTMENT_OTHER): Payer: Self-pay

## 2022-02-05 ENCOUNTER — Ambulatory Visit (HOSPITAL_BASED_OUTPATIENT_CLINIC_OR_DEPARTMENT_OTHER): Admit: 2022-02-05 | Payer: Federal, State, Local not specified - PPO | Admitting: Surgery

## 2022-02-05 HISTORY — PX: CATARACT EXTRACTION: SUR2

## 2022-02-05 SURGERY — REPAIR, HERNIA, UMBILICAL, ADULT
Anesthesia: General

## 2022-02-26 ENCOUNTER — Other Ambulatory Visit: Payer: Self-pay | Admitting: Urology

## 2022-03-03 ENCOUNTER — Telehealth: Payer: Self-pay | Admitting: *Deleted

## 2022-03-03 NOTE — Telephone Encounter (Signed)
CALLED PATIENT TO REMIND OF PRE-SEED APPTS. FOR 03-04-22, LVM FOR A RETURN CALL

## 2022-03-03 NOTE — Progress Notes (Signed)
  Radiation Oncology         [phone] ________________________________  Name: Maurice [name] MRN: [id number]  Date: 03/04/2022  DOB: 1950/11/04  SIMULATION AND TREATMENT PLANNING NOTE PUBIC ARCH STUDY  [id number], Nicki Reaper, MD  Alexis Frock, MD  DIAGNOSIS:  71 y.o. gentleman with Stage T1c adenocarcinoma of the prostate with Gleason score of 3+4, and PSA of 4.82.  Oncology History  Malignant neoplasm of prostate ([place])  12/14/2021 Cancer Staging   Staging form: Prostate, AJCC 8th Edition - Clinical stage from 12/14/2021: Stage IIB (cT1c, cN0, cM0, PSA: 4.8, Grade Group: 2) - Signed by Freeman Caldron, PA-C on 01/06/2022 Histopathologic type: Adenocarcinoma, NOS Stage prefix: Initial diagnosis Prostate specific antigen (PSA) range: Less than 10 Gleason primary pattern: 3 Gleason secondary pattern: 4 Gleason score: 7 Histologic grading system: 5 grade system Number of biopsy cores examined: 12 Number of biopsy cores positive: 4 Location of positive needle core biopsies: Both sides   01/06/2022 Initial Diagnosis   Malignant neoplasm of prostate ([place])       ICD-10-CM   1. Malignant neoplasm of prostate ([place])  C61       COMPLEX SIMULATION:  The patient presented today for evaluation for possible prostate seed implant. He was brought to the radiation planning suite and placed supine on the CT couch. A 3-dimensional image study set was obtained in upload to the planning computer. There, on each axial slice, I contoured the prostate gland. Then, using three-dimensional radiation planning tools I reconstructed the prostate in view of the structures from the transperineal needle pathway to assess for possible pubic arch interference. In doing so, I did not appreciate any pubic arch interference. Also, the patient's prostate volume was estimated based on the drawn structure. The volume was *** cc.  Given the pubic arch appearance and prostate volume, patient remains a good candidate to proceed  with prostate seed implant. Today, he freely provided informed written consent to proceed.    PLAN: The patient will undergo prostate seed implant.   ________________________________  Sheral Apley. Tammi Klippel, M.D.

## 2022-03-04 ENCOUNTER — Encounter: Payer: Self-pay | Admitting: Urology

## 2022-03-04 ENCOUNTER — Ambulatory Visit
Admission: RE | Admit: 2022-03-04 | Discharge: 2022-03-04 | Disposition: A | Discharge status: Home / Self Care | Payer: Federal, State, Local not specified - PPO | Source: Ambulatory Visit | Attending: Radiation Oncology | Admitting: Radiation Oncology

## 2022-03-04 VITALS — Resp 19 | Ht 69.0 in | Wt 192.0 lb

## 2022-03-04 DIAGNOSIS — Z510 Encounter for antineoplastic radiation therapy: Secondary | ICD-10-CM | POA: Insufficient documentation

## 2022-03-04 DIAGNOSIS — C61 Malignant neoplasm of prostate: Secondary | ICD-10-CM | POA: Insufficient documentation

## 2022-03-04 NOTE — Progress Notes (Signed)
Pre-seed nursing interview for 71 y.o. gentleman with Stage T1c adenocarcinoma of the prostate with Gleason score of 3+4, and PSA of 4.82.   I verified patient's identity and began nursing interview. Patient reports doing well.  Meaningful use complete. No urinary management medications. Urology appt- 04/09/2022 w/ Dr. Tresa Moore at [facility].  Resp 19   Ht '5\' 9"'$  (1.753 m)   Wt 192 lb (87.1 kg)   BMI 28.35 kg/m   This concludes the interview.   Leandra Kern, LPN

## 2022-03-04 NOTE — Progress Notes (Signed)
Radiation Oncology         [phone]) [phone] ________________________________  Name: Maurice [name] MRN: [id number]  Date: 03/04/2022  DOB: [birth year]  Follow-Up Visit Note  CC: [name], MD  Alexis Frock, MD  Diagnosis:   71 y.o. gentleman with Stage T1c adenocarcinoma of the prostate with Gleason score of 3+4, and PSA of 4.82.     ICD-10-CM   1. Malignant neoplasm of prostate ([facility])  C61      Narrative:  The patient returns today for routine follow-up before seed implant.  He had a few questions about his treatment options today.  To review, Maurice [name] is a 71 y.o. male with a diagnosis of prostate cancer. He is an established urology patient with a history of BPH, fluctuating PSA and gross hematuria secondary to a large, friable prostate on cystoscopy with negative hematuria work-up otherwise with Dr. Tresa Moore a few years ago.  More recently, he was noted to have an elevated PSA of 5.08 on routine labs with his PCP in 04/2021 prompting a follow up visit with urology in 05/2021. At that time, his DRE remained normal so, after thorough discussion regarding options for evaluation, the decision was made to continue monitoring the PSA with repeat labs in 6 months. His repeat PSA in 11/2021 remained elevated at 4.82 so the patient proceeded to transrectal ultrasound with 12 biopsies of the prostate on 12/14/21.  The prostate volume measured 44 cc.  Out of 12 core biopsies, 4 were positive.  The maximum Gleason score was 3+4, and this was seen in the right apex. Additionally, there were 3 cores on the left that contained Gleason 3+3.                              ALLERGIES:  has No Known Allergies.  Meds: Current Outpatient Medications  Medication Sig Dispense Refill   oxymetazoline (AFRIN) 0.05 % nasal spray Spray 2 sprays twice a day by intranasal route.     SILDENAFIL CITRATE PO Take by mouth as needed.     No current facility-administered medications for this encounter.    Physical  Findings: The patient is in no acute distress. Patient is alert and oriented.  height is '5\' 9"'$  (1.753 m) and weight is 192 lb (87.1 kg). His respiration is 19. .  No significant changes.  Lab Findings: Lab Results  Component Value Date   WBC 9.4 03/24/2016   HGB 16.0 03/24/2016   HCT 46.1 03/24/2016   PLT 236 03/24/2016    Lab Results  Component Value Date   NA 136 03/24/2016   K 4.1 03/24/2016   CO2 22 03/24/2016   GLUCOSE 101 (H) 03/24/2016   BUN 20 03/24/2016   CREATININE 0.88 03/24/2016   CALCIUM 9.0 03/24/2016   ANIONGAP 10 03/24/2016    Radiographic Findings: No results found.  Impression:  The patient is remains a good candidate for prostate brachytherapy.    Plan:  Today I re-reviewed the findings and workup thus far.  We discussed the natural history of prostate cancer.  We reviewed the the implications of T-stage, Gleason's Score, and PSA on decision-making and outcomes in prostate cancer.  We discussed radiation treatment in the management of prostate cancer with regard to the logistics and delivery of external beam radiation treatment as well as the logistics and delivery of prostate brachytherapy.  We compared and contrasted each of these approaches and also compared these against prostatectomy.  The patient expressed interest in prostate brachytherapy.     The patient would like to proceed with prostate brachytherapy on 04/09/22 with Dr. Tresa Moore    He freely provided informed written consent today with witness.  I personally spent 15 minutes in this encounter including chart review, reviewing radiological studies, meeting face-to-face with the patient, entering orders and completing documentation.   _____________________________________  Sheral Apley Tammi Klippel, M.D.

## 2022-03-08 ENCOUNTER — Emergency Department (HOSPITAL_COMMUNITY)
Admission: EM | Admit: 2022-03-08 | Discharge: 2022-03-09 | Disposition: A | Discharge status: Home / Self Care | Payer: Federal, State, Local not specified - PPO | Attending: Emergency Medicine | Admitting: Emergency Medicine

## 2022-03-08 ENCOUNTER — Emergency Department (HOSPITAL_COMMUNITY): Payer: Federal, State, Local not specified - PPO

## 2022-03-08 DIAGNOSIS — J069 Acute upper respiratory infection, unspecified: Secondary | ICD-10-CM | POA: Diagnosis not present

## 2022-03-08 DIAGNOSIS — B974 Respiratory syncytial virus as the cause of diseases classified elsewhere: Secondary | ICD-10-CM | POA: Diagnosis not present

## 2022-03-08 DIAGNOSIS — B338 Other specified viral diseases: Secondary | ICD-10-CM

## 2022-03-08 DIAGNOSIS — Z20822 Contact with and (suspected) exposure to covid-19: Secondary | ICD-10-CM | POA: Diagnosis not present

## 2022-03-08 DIAGNOSIS — R791 Abnormal coagulation profile: Secondary | ICD-10-CM | POA: Insufficient documentation

## 2022-03-08 DIAGNOSIS — C61 Malignant neoplasm of prostate: Secondary | ICD-10-CM

## 2022-03-08 DIAGNOSIS — R509 Fever, unspecified: Secondary | ICD-10-CM | POA: Diagnosis present

## 2022-03-08 HISTORY — DX: Malignant neoplasm of prostate: C61

## 2022-03-08 LAB — CBC WITH DIFFERENTIAL/PLATELET
Abs Immature Granulocytes: 0.07 10*3/uL (ref 0.00–0.07)
Basophils Absolute: 0 10*3/uL (ref 0.0–0.1)
Basophils Relative: 0 %
Eosinophils Absolute: 0 10*3/uL (ref 0.0–0.5)
Eosinophils Relative: 0 %
HCT: 45.2 % (ref 39.0–52.0)
Hemoglobin: 15.2 g/dL (ref 13.0–17.0)
Immature Granulocytes: 1 %
Lymphocytes Relative: 10 %
Lymphs Abs: 1.1 10*3/uL (ref 0.7–4.0)
MCH: 32 pg (ref 26.0–34.0)
MCHC: 33.6 g/dL (ref 30.0–36.0)
MCV: 95.2 fL (ref 80.0–100.0)
Monocytes Absolute: 0.8 10*3/uL (ref 0.1–1.0)
Monocytes Relative: 7 %
Neutro Abs: 9.3 10*3/uL — ABNORMAL HIGH (ref 1.7–7.7)
Neutrophils Relative %: 82 %
Platelets: 189 10*3/uL (ref 150–400)
RBC: 4.75 MIL/uL (ref 4.22–5.81)
RDW: 12.3 % (ref 11.5–15.5)
WBC: 11.3 10*3/uL — ABNORMAL HIGH (ref 4.0–10.5)
nRBC: 0 % (ref 0.0–0.2)

## 2022-03-08 LAB — APTT: aPTT: 33 seconds (ref 24–36)

## 2022-03-08 LAB — RESP PANEL BY RT-PCR (RSV, FLU A&B, COVID)  RVPGX2
Influenza A by PCR: NEGATIVE
Influenza B by PCR: NEGATIVE
Resp Syncytial Virus by PCR: POSITIVE — AB
SARS Coronavirus 2 by RT PCR: NEGATIVE

## 2022-03-08 LAB — PROTIME-INR
INR: 1.1 (ref 0.8–1.2)
Prothrombin Time: 13.8 seconds (ref 11.4–15.2)

## 2022-03-08 MED ORDER — LACTATED RINGERS IV SOLN: INTRAVENOUS | Status: DC

## 2022-03-08 MED ORDER — LACTATED RINGERS IV BOLUS (SEPSIS)
1000.0000 mL | Freq: Once | INTRAVENOUS | Status: AC
  Administered 2022-03-08: 1000 mL via INTRAVENOUS

## 2022-03-08 NOTE — ED Provider Triage Note (Signed)
Emergency Medicine Provider Triage Evaluation Note  Maurice [name] , a 72 y.o. male  was evaluated in triage.  Pt complains of fever since Thursday. States he began coughing Thursday as well. Fevers have been daily. States that until tonight Motrin has been helping with fever. Tonight he took motrin, retook his fever and it was still 103. He took 2 more motrin and came to the ER. Has cough, wheezing. Denies shortness of breath or chest pain, dysuria, vomiting, diarrhea. Decreased PO intake over the past week.  Review of Systems  Positive: See above Negative:   Physical Exam  BP (!) 136/96 (BP Location: Left Arm)   Pulse (!) 132   Temp 99.4 F (37.4 C) (Oral)   Resp 20   Ht '5\' 9"'$  (1.753 m)   Wt 83.9 kg   SpO2 95%   BMI 27.32 kg/m  Gen:   Awake, no distress   Resp:  Normal effort  MSK:   Moves extremities without difficulty  Other:  LLL with rhonchi. Wheezing in RLL   Medical Decision Making  Medically screening exam initiated at 10:46 PM.  Appropriate orders placed.  Laurent Cargile was informed that the remainder of the evaluation will be completed by another provider, this initial triage assessment does not replace that evaluation, and the importance of remaining in the ED until their evaluation is complete.     Mickie Hillier, PA-C 03/08/22 2249

## 2022-03-08 NOTE — ED Triage Notes (Addendum)
Pt reports fever since Thursday. Has been treating with motrin. Tmax 103 at home today. Last motrin at 930p. Endorses productive cough rib pain with cough. Was around family at Christmas, but unsure about any sick contacts. Wife is also sick with similar symptoms.

## 2022-03-08 NOTE — ED Provider Notes (Signed)
[facility] DEPT Provider Note   CSN: [id number] Arrival date & time: 03/08/22  2232     History {Add pertinent medical, surgical, social history, OB history to HPI:1} Chief Complaint  Patient presents with   Fever    Maurice [name] is a 72 y.o. male.  HPI     This is a 72 year old male who presents with concern for fever and upper respiratory symptoms.  Patient reports that his symptoms started on Tuesday with a runny nose.  He began to develop a fever on Thursday.  He states that the fever initially was responsive to Motrin.  However today he had fever up to 103 and took Motrin but continued to have ongoing body aches.  He states he has had a cough that has been productive.  He describes pain across his bilateral flanks when he coughs.  Reports that his wife and daughter have both been sick.  Unsure whether they have tested positive for any viral illnesses.  Home Medications Prior to Admission medications   Medication Sig Start Date End Date Taking? Authorizing Provider  oxymetazoline (AFRIN) 0.05 % nasal spray Spray 2 sprays twice a day by intranasal route.    [provider]  SILDENAFIL CITRATE PO Take by mouth as needed.    [provider]      Allergies    Patient has no known allergies.    Review of Systems   Review of Systems  Constitutional:  Positive for fever.  HENT:  Positive for congestion.   Respiratory:  Positive for cough. Negative for shortness of breath.   Cardiovascular:  Negative for chest pain.  All other systems reviewed and are negative.   Physical Exam Updated Vital Signs BP (!) 118/94   Pulse (!) 116   Temp (!) 102.3 F (39.1 C) (Oral)   Resp 20   Ht 1.753 m ('5\' 9"'$ )   Wt 83.9 kg   SpO2 93%   BMI 27.32 kg/m  Physical Exam Vitals and nursing note reviewed.  Constitutional:      Appearance: He is well-developed. He is not ill-appearing.  HENT:     Head: Normocephalic and atraumatic.      Mouth/Throat:     Mouth: Mucous membranes are moist.  Eyes:     Pupils: Pupils are equal, round, and reactive to light.  Cardiovascular:     Rate and Rhythm: Regular rhythm. Tachycardia present.     Heart sounds: Normal heart sounds. No murmur heard. Pulmonary:     Effort: Pulmonary effort is normal. No respiratory distress.     Breath sounds: Rhonchi present. No wheezing.     Comments: Rhonchorous breath sounds left upper lobe, no respiratory distress Abdominal:     General: Bowel sounds are normal.     Palpations: Abdomen is soft.     Tenderness: There is no abdominal tenderness. There is no rebound.  Musculoskeletal:     Cervical back: Neck supple.  Lymphadenopathy:     Cervical: No cervical adenopathy.  Skin:    General: Skin is warm and dry.  Neurological:     Mental Status: He is alert and oriented to person, place, and time.  Psychiatric:        Mood and Affect: Mood normal.     ED Results / Procedures / Treatments   Labs (all labs ordered are listed, but only abnormal results are displayed) Labs Reviewed  RESP PANEL BY RT-PCR (RSV, FLU A&B, COVID)  [id number]  CULTURE, BLOOD (ROUTINE  X 2)  CULTURE, BLOOD (ROUTINE X 2)  URINE CULTURE  LACTIC ACID, PLASMA  LACTIC ACID, PLASMA  COMPREHENSIVE METABOLIC PANEL  CBC WITH DIFFERENTIAL/PLATELET  PROTIME-INR  APTT  URINALYSIS, ROUTINE W REFLEX MICROSCOPIC    EKG None  Radiology DG Chest Port 1 View  Result Date: 03/08/2022 CLINICAL DATA:  Possible sepsis.  Fever for 3 days. EXAM: PORTABLE CHEST 1 VIEW COMPARISON:  04/02/2021. FINDINGS: The heart size and mediastinal contours are within normal limits. No consolidation, effusion, or pneumothorax. No acute osseous abnormality. IMPRESSION: No active disease. Electronically Signed   By: Brett Fairy M.D.   On: 03/08/2022 23:11    Procedures Procedures  {Document cardiac monitor, telemetry assessment procedure when appropriate:1}  Medications Ordered in ED Medications   lactated ringers infusion (has no administration in time range)  lactated ringers bolus 1,000 mL (has no administration in time range)    And  lactated ringers bolus 1,000 mL (has no administration in time range)    And  lactated ringers bolus 1,000 mL (has no administration in time range)    ED Course/ Medical Decision Making/ A&P                           Medical Decision Making  ***  {Document critical care time when appropriate:1} {Document review of labs and clinical decision tools ie heart score, Chads2Vasc2 etc:1}  {Document your independent review of radiology images, and any outside records:1} {Document your discussion with family members, caretakers, and with consultants:1} {Document social determinants of health affecting pt's care:1} {Document your decision making why or why not admission, treatments were needed:1} Final Clinical Impression(s) / ED Diagnoses Final diagnoses:  None    Rx / DC Orders ED Discharge Orders     None

## 2022-03-08 NOTE — Sepsis Progress Note (Signed)
[facility] monitoring for the code sepsis protocol.  

## 2022-03-09 LAB — LACTIC ACID, PLASMA: Lactic Acid, Venous: 1.6 mmol/L (ref 0.5–1.9)

## 2022-03-09 LAB — COMPREHENSIVE METABOLIC PANEL
ALT: 52 U/L — ABNORMAL HIGH (ref 0–44)
AST: 44 U/L — ABNORMAL HIGH (ref 15–41)
Albumin: 3.8 g/dL (ref 3.5–5.0)
Alkaline Phosphatase: 54 U/L (ref 38–126)
Anion gap: 8 (ref 5–15)
BUN: 21 mg/dL (ref 8–23)
CO2: 21 mmol/L — ABNORMAL LOW (ref 22–32)
Calcium: 8.5 mg/dL — ABNORMAL LOW (ref 8.9–10.3)
Chloride: 105 mmol/L (ref 98–111)
Creatinine, Ser: 1.14 mg/dL (ref 0.61–1.24)
GFR, Estimated: >60 mL/min (ref >60)
Glucose, Bld: 149 mg/dL — ABNORMAL HIGH (ref 70–99)
Potassium: 3.8 mmol/L (ref 3.5–5.1)
Sodium: 134 mmol/L — ABNORMAL LOW (ref 135–145)
Total Bilirubin: 0.5 mg/dL (ref 0.3–1.2)
Total Protein: 6.8 g/dL (ref 6.5–8.1)

## 2022-03-09 MED ORDER — ACETAMINOPHEN 500 MG PO TABS
1000.0000 mg | ORAL_TABLET | Freq: Once | ORAL | Status: AC
  Administered 2022-03-09: 1000 mg via ORAL
  Filled 2022-03-09: qty 2

## 2022-03-09 NOTE — Discharge Instructions (Signed)
You were seen today for fever and cough.  You tested positive for RSV.  Your chest x-ray does not show any evidence of bacterial pneumonia; however, if symptoms or not improving in several days, you need to be reassessed.  Monitor your pulse ox at home.  Your oxygen levels are borderline.  If they drop below 89%, you need to be reevaluated.  Take Tylenol or ibuprofen as needed for fever.  Make sure that you are staying hydrated.

## 2022-03-10 ENCOUNTER — Encounter (HOSPITAL_BASED_OUTPATIENT_CLINIC_OR_DEPARTMENT_OTHER): Payer: Self-pay

## 2022-03-10 ENCOUNTER — Inpatient Hospital Stay (HOSPITAL_BASED_OUTPATIENT_CLINIC_OR_DEPARTMENT_OTHER)
Admission: EM | Admit: 2022-03-10 | Discharge: 2022-03-12 | DRG: 871 | Disposition: A | Discharge status: Home / Self Care | Payer: Medicare Other | Attending: Student | Admitting: Student

## 2022-03-10 ENCOUNTER — Inpatient Hospital Stay: Admit: 2022-03-10 | Payer: Federal, State, Local not specified - PPO | Admitting: Internal Medicine

## 2022-03-10 ENCOUNTER — Emergency Department (HOSPITAL_BASED_OUTPATIENT_CLINIC_OR_DEPARTMENT_OTHER): Payer: Medicare Other

## 2022-03-10 ENCOUNTER — Other Ambulatory Visit: Payer: Self-pay

## 2022-03-10 ENCOUNTER — Emergency Department (HOSPITAL_COMMUNITY)
Admission: EM | Admit: 2022-03-10 | Discharge: 2022-03-10 | Discharge status: Against Medical Advice | Payer: Federal, State, Local not specified - PPO

## 2022-03-10 ENCOUNTER — Emergency Department (HOSPITAL_BASED_OUTPATIENT_CLINIC_OR_DEPARTMENT_OTHER): Payer: Medicare Other | Admitting: Radiology

## 2022-03-10 ENCOUNTER — Encounter (HOSPITAL_COMMUNITY): Payer: Self-pay

## 2022-03-10 DIAGNOSIS — J9601 Acute respiratory failure with hypoxia: Secondary | ICD-10-CM | POA: Diagnosis present

## 2022-03-10 DIAGNOSIS — R6520 Severe sepsis without septic shock: Secondary | ICD-10-CM | POA: Diagnosis present

## 2022-03-10 DIAGNOSIS — Z1152 Encounter for screening for COVID-19: Secondary | ICD-10-CM

## 2022-03-10 DIAGNOSIS — E876 Hypokalemia: Secondary | ICD-10-CM | POA: Diagnosis present

## 2022-03-10 DIAGNOSIS — E871 Hypo-osmolality and hyponatremia: Secondary | ICD-10-CM | POA: Diagnosis present

## 2022-03-10 DIAGNOSIS — R062 Wheezing: Secondary | ICD-10-CM | POA: Diagnosis not present

## 2022-03-10 DIAGNOSIS — A419 Sepsis, unspecified organism: Secondary | ICD-10-CM | POA: Diagnosis not present

## 2022-03-10 DIAGNOSIS — J189 Pneumonia, unspecified organism: Secondary | ICD-10-CM | POA: Diagnosis not present

## 2022-03-10 DIAGNOSIS — Z825 Family history of asthma and other chronic lower respiratory diseases: Secondary | ICD-10-CM

## 2022-03-10 DIAGNOSIS — J121 Respiratory syncytial virus pneumonia: Secondary | ICD-10-CM | POA: Diagnosis not present

## 2022-03-10 DIAGNOSIS — E872 Acidosis, unspecified: Secondary | ICD-10-CM | POA: Diagnosis present

## 2022-03-10 DIAGNOSIS — E785 Hyperlipidemia, unspecified: Secondary | ICD-10-CM | POA: Diagnosis present

## 2022-03-10 DIAGNOSIS — R0902 Hypoxemia: Secondary | ICD-10-CM

## 2022-03-10 DIAGNOSIS — Z809 Family history of malignant neoplasm, unspecified: Secondary | ICD-10-CM

## 2022-03-10 DIAGNOSIS — J40 Bronchitis, not specified as acute or chronic: Secondary | ICD-10-CM | POA: Diagnosis present

## 2022-03-10 DIAGNOSIS — C61 Malignant neoplasm of prostate: Secondary | ICD-10-CM | POA: Diagnosis present

## 2022-03-10 DIAGNOSIS — Z8546 Personal history of malignant neoplasm of prostate: Secondary | ICD-10-CM

## 2022-03-10 LAB — CBC WITH DIFFERENTIAL/PLATELET
Abs Immature Granulocytes: 0.07 10*3/uL (ref 0.00–0.07)
Basophils Absolute: 0 10*3/uL (ref 0.0–0.1)
Basophils Relative: 0 %
Eosinophils Absolute: 0.1 10*3/uL (ref 0.0–0.5)
Eosinophils Relative: 1 %
HCT: 40.1 % (ref 39.0–52.0)
Hemoglobin: 13.9 g/dL (ref 13.0–17.0)
Immature Granulocytes: 1 %
Lymphocytes Relative: 15 %
Lymphs Abs: 1.2 10*3/uL (ref 0.7–4.0)
MCH: 32.1 pg (ref 26.0–34.0)
MCHC: 34.7 g/dL (ref 30.0–36.0)
MCV: 92.6 fL (ref 80.0–100.0)
Monocytes Absolute: 0.7 10*3/uL (ref 0.1–1.0)
Monocytes Relative: 8 %
Neutro Abs: 6 10*3/uL (ref 1.7–7.7)
Neutrophils Relative %: 75 %
Platelets: 203 10*3/uL (ref 150–400)
RBC: 4.33 MIL/uL (ref 4.22–5.81)
RDW: 12.4 % (ref 11.5–15.5)
WBC: 8 10*3/uL (ref 4.0–10.5)
nRBC: 0 % (ref 0.0–0.2)

## 2022-03-10 LAB — LACTIC ACID, PLASMA: Lactic Acid, Venous: 2.9 mmol/L (ref 0.5–1.9)

## 2022-03-10 LAB — BASIC METABOLIC PANEL
Anion gap: 10 (ref 5–15)
BUN: 12 mg/dL (ref 8–23)
CO2: 22 mmol/L (ref 22–32)
Calcium: 9.1 mg/dL (ref 8.9–10.3)
Chloride: 104 mmol/L (ref 98–111)
Creatinine, Ser: 0.8 mg/dL (ref 0.61–1.24)
GFR, Estimated: >60 mL/min (ref >60)
Glucose, Bld: 101 mg/dL — ABNORMAL HIGH (ref 70–99)
Potassium: 3.5 mmol/L (ref 3.5–5.1)
Sodium: 136 mmol/L (ref 135–145)

## 2022-03-10 MED ORDER — DOXYCYCLINE HYCLATE 100 MG PO TABS
100.0000 mg | ORAL_TABLET | Freq: Once | ORAL | Status: AC
  Administered 2022-03-10: 100 mg via ORAL
  Filled 2022-03-10: qty 1

## 2022-03-10 MED ORDER — HYDROCODONE-ACETAMINOPHEN 5-325 MG PO TABS
1.0000 | ORAL_TABLET | Freq: Once | ORAL | Status: AC
  Administered 2022-03-10: 1 via ORAL
  Filled 2022-03-10: qty 1

## 2022-03-10 MED ORDER — ALBUTEROL SULFATE HFA 108 (90 BASE) MCG/ACT IN AERS
2.0000 | INHALATION_SPRAY | RESPIRATORY_TRACT | Status: DC | PRN
  Administered 2022-03-10 (×2): 2 via RESPIRATORY_TRACT

## 2022-03-10 MED ORDER — ACETAMINOPHEN 500 MG PO TABS
1000.0000 mg | ORAL_TABLET | Freq: Once | ORAL | Status: AC
  Administered 2022-03-10: 1000 mg via ORAL
  Filled 2022-03-10: qty 2

## 2022-03-10 MED ORDER — IBUPROFEN 400 MG PO TABS
600.0000 mg | ORAL_TABLET | Freq: Once | ORAL | Status: AC
  Administered 2022-03-10: 600 mg via ORAL
  Filled 2022-03-10: qty 1

## 2022-03-10 MED ORDER — BENZONATATE 100 MG PO CAPS
200.0000 mg | ORAL_CAPSULE | Freq: Once | ORAL | Status: AC
  Administered 2022-03-10: 200 mg via ORAL
  Filled 2022-03-10: qty 2

## 2022-03-10 MED ORDER — SODIUM CHLORIDE 0.9 % IV SOLN
2.0000 g | Freq: Once | INTRAVENOUS | Status: AC
  Administered 2022-03-10: 2 g via INTRAVENOUS
  Filled 2022-03-10: qty 20

## 2022-03-10 MED ORDER — IPRATROPIUM-ALBUTEROL 0.5-2.5 (3) MG/3ML IN SOLN
3.0000 mL | Freq: Once | RESPIRATORY_TRACT | Status: AC
  Administered 2022-03-10: 3 mL via RESPIRATORY_TRACT
  Filled 2022-03-10: qty 3

## 2022-03-10 MED ORDER — HYDROCODONE BIT-HOMATROP MBR 5-1.5 MG/5ML PO SOLN
5.0000 mL | Freq: Once | ORAL | Status: DC
  Filled 2022-03-10: qty 5

## 2022-03-10 MED ORDER — ALBUTEROL SULFATE HFA 108 (90 BASE) MCG/ACT IN AERS
INHALATION_SPRAY | RESPIRATORY_TRACT | Status: AC
  Filled 2022-03-10: qty 6.7

## 2022-03-10 NOTE — ED Notes (Signed)
Report given to [facility] via telephone. Approx 15 mins from facility to transport pt.

## 2022-03-10 NOTE — ED Notes (Signed)
Pt off the unit with [facility] transport.

## 2022-03-10 NOTE — ED Provider Notes (Signed)
[facility] EMERGENCY DEPT Provider Note   CSN: [id number] Arrival date & time: 03/10/22  1126     History  Chief Complaint  Patient presents with   Cough    Maurice [name] is a 72 y.o. male.  HPI Patient reports he started getting fever and cough about 6 days ago.  He reports that the onset of the cough was dry and then productive of clear sputum.  He was seen at the emergency department on January 1.  At that time he was diagnosed with RSV.  Patient reports he has been controlling fever at home with ibuprofen and Tylenol.  His symptoms were not improving but over the past day now it has gotten worse.  He reports now the cough is productive of green sputum.  He is having trouble sleeping at night because he is wheezing and coughing.  Reports has been several nights that he has been restless and unable to sleep.  No vomiting or diarrhea.  No abdominal pain.  No lower extremity swelling or calf pain.    Home Medications Prior to Admission medications   Medication Sig Start Date End Date Taking? Authorizing Provider  oxymetazoline (AFRIN) 0.05 % nasal spray Spray 2 sprays twice a day by intranasal route.    [provider]  SILDENAFIL CITRATE PO Take by mouth as needed.    [provider]      Allergies    Patient has no known allergies.    Review of Systems   Review of Systems  Physical Exam Updated Vital Signs BP 132/68   Pulse 100   Temp (!) 100.9 F (38.3 C)   Resp (!) 22   Ht '5\' 9"'$  (1.753 m)   Wt 83.9 kg   SpO2 93%   BMI 27.31 kg/m  Physical Exam Constitutional:      Comments: Patient is alert with clear mental status.  Mild tachypnea at rest.  No respiratory distress.  Well-nourished well-developed.  HENT:     Mouth/Throat:     Pharynx: Oropharynx is clear.  Eyes:     Extraocular Movements: Extraocular movements intact.  Cardiovascular:     Comments: Borderline tachycardia.  No rub murmur gallop. Pulmonary:     Comments:  Tachypnea.  No respiratory distress at rest.  Coarse wheeze bilaterally. Abdominal:     General: There is no distension.     Palpations: Abdomen is soft.     Tenderness: There is no abdominal tenderness. There is no guarding.  Musculoskeletal:        General: No swelling. Normal range of motion.     Right lower leg: No edema.     Left lower leg: No edema.  Skin:    General: Skin is warm and dry.  Neurological:     General: No focal deficit present.     Mental Status: He is oriented to person, place, and time.     Motor: No weakness.     Coordination: Coordination normal.  Psychiatric:        Mood and Affect: Mood normal.     ED Results / Procedures / Treatments   Labs (all labs ordered are listed, but only abnormal results are displayed) Labs Reviewed  BASIC METABOLIC PANEL - Abnormal; Notable for the following components:      Result Value   Glucose, Bld 101 (*)    All other components within normal limits  CULTURE, BLOOD (ROUTINE X 2)  CULTURE, BLOOD (ROUTINE X 2)  CBC WITH DIFFERENTIAL/PLATELET  LACTIC ACID, PLASMA  LACTIC ACID, PLASMA    EKG None  Radiology CT Chest Wo Contrast  Result Date: 03/10/2022 CLINICAL DATA:  Respiratory illness.  Cough for 6 days. EXAM: CT CHEST WITHOUT CONTRAST TECHNIQUE: Multidetector CT imaging of the chest was performed following the standard protocol without IV contrast. RADIATION DOSE REDUCTION: This exam was performed according to the departmental dose-optimization program which includes automated exposure control, adjustment of the mA and/or kV according to patient size and/or use of iterative reconstruction technique. COMPARISON:  Chest radiograph 03/10/2022 FINDINGS: Cardiovascular: Normal heart size. Lad coronary artery calcifications. Trace anterior pericardial fluid. Mediastinum/Nodes: No enlarged mediastinal or axillary lymph nodes. Thyroid gland, trachea, and esophagus demonstrate no significant findings. Lungs/Pleura: No pleural  fluid. There is diffuse bronchial wall thickening noted. Central airways appear patent. There is diffuse bronchial wall thickening. Bilateral lower lobe airspace disease is noted, right greater than left. Subsegmental atelectasis noted in the right middle lobe. No suspicious pulmonary nodule or mass identified at this time. Upper Abdomen: No acute abnormality. Musculoskeletal: No chest wall mass or suspicious bone lesions identified. Multilevel thoracic degenerative disc disease. IMPRESSION: 1. Bilateral lower lobe airspace disease, right greater than left, compatible with pneumonia. Follow-up imaging advised to ensure complete resolution and to rule out underlying malignancy. 2. Diffuse bronchial wall thickening compatible with bronchitis. 3. Right middle lobe subsegmental atelectasis. 4. Coronary artery calcifications. Electronically Signed   By: Kerby Moors M.D.   On: 03/10/2022 18:10   DG Chest 2 View  Result Date: 03/10/2022 CLINICAL DATA:  Worsening cough.  RSV 8 days ago. EXAM: CHEST - 2 VIEW COMPARISON:  None Available. FINDINGS: No pneumothorax or effusion. Normal cardiopericardial silhouette without view. Focal consolidation along the left lower lobe posteromedial. Possible infiltrate. Recommend follow-up to confirm clearance. Degenerative changes along the spine IMPRESSION: Focal opacity along the inferior medial left lower lobe. Possible infiltrate. Recommend follow-up Electronically Signed   By: Jill Side M.D.   On: 03/10/2022 12:29   DG Chest Port 1 View  Result Date: 03/08/2022 CLINICAL DATA:  Possible sepsis.  Fever for 3 days. EXAM: PORTABLE CHEST 1 VIEW COMPARISON:  04/02/2021. FINDINGS: The heart size and mediastinal contours are within normal limits. No consolidation, effusion, or pneumothorax. No acute osseous abnormality. IMPRESSION: No active disease. Electronically Signed   By: Brett Fairy M.D.   On: 03/08/2022 23:11    Procedures Procedures    Medications Ordered in  ED Medications  albuterol (VENTOLIN HFA) 108 (90 Base) MCG/ACT inhaler 2 puff (2 puffs Inhalation Given 03/10/22 1652)  HYDROcodone bit-homatropine (HYCODAN) 5-1.5 MG/5ML syrup 5 mL (0 mLs Oral Hold 03/10/22 1937)  ipratropium-albuterol (DUONEB) 0.5-2.5 (3) MG/3ML nebulizer solution 3 mL (has no administration in time range)  acetaminophen (TYLENOL) tablet 1,000 mg (1,000 mg Oral Given 03/10/22 1720)  ipratropium-albuterol (DUONEB) 0.5-2.5 (3) MG/3ML nebulizer solution 3 mL (3 mLs Nebulization Given 03/10/22 1818)  benzonatate (TESSALON) capsule 200 mg (200 mg Oral Given 03/10/22 1814)  cefTRIAXone (ROCEPHIN) 2 g in sodium chloride 0.9 % 100 mL IVPB (0 g Intravenous Stopped 03/10/22 2004)  doxycycline (VIBRA-TABS) tablet 100 mg (100 mg Oral Given 03/10/22 2002)  ibuprofen (ADVIL) tablet 600 mg (600 mg Oral Given 03/10/22 2037)  HYDROcodone-acetaminophen (NORCO/VICODIN) 5-325 MG per tablet 1 tablet (1 tablet Oral Given 03/10/22 2037)    ED Course/ Medical Decision Making/ A&P  Medical Decision Making Amount and/or Complexity of Data Reviewed Labs: ordered. Radiology: ordered.  Risk OTC drugs. Prescription drug management. Decision regarding hospitalization.  Patient presents with worsening cough after diagnosis of RSV 2 days ago.  Reports his fevers have persisted and as high as 103.  Over the past day he is got much worse and now has cough productive of green sputum.  He is felt increasing short of breath.  He has been monitoring his oxygen saturation at home and has been 88 to 90%.  At this time we will proceed with CT chest to determine if there is pneumonia with consolidation more suggestive of bacterial pneumonia.  Patient has extensive wheezing.  Will proceed with DuoNeb and [employer].  CT chest shows bilateral consolidation in the lower lungs consistent with bilateral pneumonia.  Will start Rocephin and doxycycline.   After DuoNeb and antibiotics, patient  continues to have significant wheezing bilaterally.  Temperature is up to 100.9 and he is having chills again.  Oxygen saturation on room air is 89 to 90%.  Will place on 2 L oxygen, repeat DuoNeb, administer ibuprofen for fever.  Plan for admission for persisting SIRS symptoms with tachycardia and tachypnea with hypoxia and pneumonia.  Consult: Dr. Bridgett Larsson accepts for admission [facility] hospitalist.       Final Clinical Impression(s) / ED Diagnoses Final diagnoses:  Pneumonia of both lower lobes due to infectious organism  Hypoxia  Wheeze    Rx / DC Orders ED Discharge Orders     None         Charlesetta Shanks, MD 03/10/22 2043

## 2022-03-10 NOTE — ED Triage Notes (Signed)
Patient here POV from Home.  Endorses Fever and Cough that began 6 Days ago. Visited an ED 2 Days ago and was diagnosed with RSV. States he seeks Evaluation as his symptoms have worsened.   NAD Noted during Triage. A&OX4. GCS 15. Ambulatory.

## 2022-03-10 NOTE — H&P (Signed)
Maurice [name] FRT:[id number] DOB: [birth year] DOA: 03/10/2022     PCP: [name], MD   Outpatient Specialists:   Radiology oncology Dr. Tammi Klippel Patient arrived to ER on 03/10/22 at 1126 Referred by Attending Kristopher Oppenheim, DO   Patient coming from:    home Lives  With family   Chief Complaint:  Chief Complaint  Patient presents with   Cough    HPI: Maurice [name] is a 72 y.o. male with medical history significant of HLD, prostate cancer    Presented with worsening cough 6 days cough he went to the emergency department 2 days ago and was diagnosed with RSV but continued to have worsening shortness of breath and wheezing. Has been febrile up to 102 has been taking ibuprofen and Tylenol at home but not improving now cough is productive of green sputum he has been having trouble sleeping at night because he has trouble coughing no vomiting no abdominal pain patient On arrival temperature 100.9 pulse 100 Given albuterol nebulizers and DuoNeb At home noted to be hypoxic down to 88% on room air  Reports symptoms has been ongoing since last Thursday  Drinks couple of drinks a day not for the past week  Reports high fever On Monday the fever ws up to 103  Has been feeling a bit better now   Initial COVID TEST  NEGATIVE Lab Results  Component Value Date   [place] NEGATIVE 03/08/2022     Regarding pertinent Chronic problems:    Hyperlipidemia -not on statins      While in ER:   Sepsis found to have pneumonia bilateral right greater than left started on Rocephin azithromycin also noted to be hypoxic 89 to 90% on room air needing up to 3 L of oxygen    CXR - Focal opacity along the inferior medial left lower lobe. Possible infiltrate. Recommend follow-up   CT  chest -  Bilateral lower lobe airspace disease, right greater than left, compatible with pneumonia.  Following Medications were ordered in ER: Medications  albuterol (VENTOLIN HFA) 108 (90 Base) MCG/ACT  inhaler 2 puff (2 puffs Inhalation Given 03/10/22 1652)  HYDROcodone bit-homatropine (HYCODAN) 5-1.5 MG/5ML syrup 5 mL (0 mLs Oral Hold 03/10/22 1937)  acetaminophen (TYLENOL) tablet 1,000 mg (1,000 mg Oral Given 03/10/22 1720)  ipratropium-albuterol (DUONEB) 0.5-2.5 (3) MG/3ML nebulizer solution 3 mL (3 mLs Nebulization Given 03/10/22 1818)  benzonatate (TESSALON) capsule 200 mg (200 mg Oral Given 03/10/22 1814)  cefTRIAXone (ROCEPHIN) 2 g in sodium chloride 0.9 % 100 mL IVPB (0 g Intravenous Stopped 03/10/22 2004)  doxycycline (VIBRA-TABS) tablet 100 mg (100 mg Oral Given 03/10/22 2002)  ipratropium-albuterol (DUONEB) 0.5-2.5 (3) MG/3ML nebulizer solution 3 mL (3 mLs Nebulization Given 03/10/22 2042)  ibuprofen (ADVIL) tablet 600 mg (600 mg Oral Given 03/10/22 2037)  HYDROcodone-acetaminophen (NORCO/VICODIN) 5-325 MG per tablet 1 tablet (1 tablet Oral Given 03/10/22 2037)       ED Triage Vitals  Enc Vitals Group     BP 03/10/22 1155 (!) 173/89     Pulse Rate 03/10/22 1155 91     Resp 03/10/22 1155 20     Temp 03/10/22 1155 99.3 F (37.4 C)     Temp Source 03/10/22 1646 Oral     SpO2 03/10/22 1155 96 %     Weight 03/10/22 1154 184 lb 15.5 oz (83.9 kg)     Height 03/10/22 1154 _0  (1.753 m)     Head Circumference --      Peak  Flow --      Pain Score 03/10/22 2339 0     Pain Loc --      Pain Edu? --      Excl. in [place]? --   TMAX(24)@     _________________________________________ Significant initial  Findings: Abnormal Labs Reviewed  BASIC METABOLIC PANEL - Abnormal; Notable for the following components:      Result Value   Glucose, Bld 101 (*)    All other components within normal limits  LACTIC ACID, PLASMA - Abnormal; Notable for the following components:   Lactic Acid, Venous 2.9 (*)    All other components within normal limits    ECG: Ordered Personally reviewed and interpreted by me showing: HR : 90 Rhythm:  NSR,   no evidence of ischemic changes QTC 440 ____________________ This  patient meets SIRS Criteria and may be septic.    The recent clinical data is shown below. Vitals:   03/10/22 2219 03/10/22 2219 03/10/22 2219 03/10/22 2339  BP:    125/79  Pulse: (!) 104   95  Resp:   20 20  Temp:  99.4 F (37.4 C)  99 F (37.2 C)  TempSrc:  Oral  Oral  SpO2: 92%   95%  Weight:      Height:        WBC     Component Value Date/Time   WBC 8.0 03/10/2022 1205   LYMPHSABS 1.2 03/10/2022 1205   MONOABS 0.7 03/10/2022 1205   EOSABS 0.1 03/10/2022 1205   BASOSABS 0.0 03/10/2022 1205     Lactic Acid, Venous    Component Value Date/Time   LATICACIDVEN 2.9 (HH) 03/10/2022 2047     Procalcitonin   Ordered    UA  ordered     Results for orders placed or performed during the hospital encounter of 03/08/22  Resp panel by RT-PCR (RSV, Flu A&B, Covid) Anterior Nasal Swab     Status: Abnormal   Collection Time: 03/08/22 10:46 PM   Specimen: Anterior Nasal Swab  Result Value Ref Range Status   SARS Coronavirus 2 by RT PCR NEGATIVE NEGATIVE Final         Influenza A by PCR NEGATIVE NEGATIVE Final   Influenza B by PCR NEGATIVE NEGATIVE Final         Resp Syncytial Virus by PCR POSITIVE (A) NEGATIVE Final        Blood Culture (routine x 2)     Status: None (Preliminary result)   Collection Time: 03/08/22 11:20 PM   Specimen: BLOOD  Result Value Ref Range Status   Specimen Description   Final    BLOOD BLOOD LEFT HAND Performed at [facility], [place] [address] [place], [place] [address]    Special Requests   Final    BOTTLES DRAWN AEROBIC AND ANAEROBIC Blood Culture adequate volume Performed at [facility] [address], [place], [place] [address]    Culture   Final    NO GROWTH 1 DAY Performed at [facility], [place] [address], [place], [place] [address]    Report Status PENDING  Incomplete  Blood Culture (routine x 2)     Status: None (Preliminary result)   Collection Time: 03/08/22 11:20 PM   Specimen: BLOOD   Result Value Ref Range Status   Specimen Description   Final    BLOOD LEFT ANTECUBITAL Performed at [facility] [address], [place], [place] [address]    Special Requests  Final    BOTTLES DRAWN AEROBIC AND ANAEROBIC Blood Culture adequate volume Performed at [facility] [address], [place], [place] [address]    Culture   Final    NO GROWTH 1 DAY Performed at [facility], [place] [address], [place], [place] [address]    Report Status PENDING  Incomplete    _______________________________________________ Hospitalist was called for admission for   Pneumonia of both lower lobes due to infectious organism  Hypoxia, reactive airway disease  The following Work up has been ordered so far:  Orders Placed This Encounter  Procedures   Culture, blood (routine x 2)   DG Chest 2 View   CT Chest Wo Contrast   Basic metabolic panel   CBC with Differential   Lactic acid, plasma   Nursing communication   Consult to hospitalist   Incentive spirometry RT   Flutter valve   Place in observation (patient's expected length of stay will be less than 2 midnights)     OTHER Significant initial  Findings:  labs showing:  Recent Labs  Lab 03/08/22 2320 03/10/22 1205  NA 134* 136  K 3.8 3.5  CO2 21* 22  GLUCOSE 149* 101*  BUN 21 12  CREATININE 1.14 0.80  CALCIUM 8.5* 9.1    Cr   stable,  Lab Results  Component Value Date   CREATININE 0.80 03/10/2022   CREATININE 1.14 03/08/2022   CREATININE 0.88 03/24/2016    Recent Labs  Lab 03/08/22 2320  AST 44*  ALT 52*  ALKPHOS 54  BILITOT 0.5  PROT 6.8  ALBUMIN 3.8   Lab Results  Component Value Date   CALCIUM 9.1 03/10/2022    Plt: Lab Results  Component Value Date   PLT 203 03/10/2022    COVID-19 Labs  No results for input(s): "DDIMER", "FERRITIN", "LDH", "CRP" in the last 72 hours.  Lab Results  Component Value Date   SARSCOV2NAA NEGATIVE 03/08/2022         Recent Labs  Lab 03/08/22 2320 03/10/22 1205  WBC 11.3* 8.0  NEUTROABS 9.3* 6.0  HGB 15.2 13.9  HCT 45.2 40.1  MCV 95.2 92.6  PLT 189 203    HG/HCT  stable,       Component Value Date/Time   HGB 13.9 03/10/2022 1205   HCT 40.1 03/10/2022 1205   MCV 92.6 03/10/2022 1205        Cultures:    Component Value Date/Time   SDES  03/08/2022 2320    BLOOD BLOOD LEFT HAND Performed at [facility], [place] [address] [place], [place] [address]    SDES  03/08/2022 2320    BLOOD LEFT ANTECUBITAL Performed at [facility], [place] [address] [place], [place] [address]    SPECREQUEST  03/08/2022 2320    BOTTLES DRAWN AEROBIC AND ANAEROBIC Blood Culture adequate volume Performed at [facility], [place] [address] [place], [place] [address]    SPECREQUEST  03/08/2022 2320    BOTTLES DRAWN AEROBIC AND ANAEROBIC Blood Culture adequate volume Performed at [facility], [place] [address], [place], [place] [address]    CULT  03/08/2022 2320    NO GROWTH 1 DAY Performed at [facility] [address], [place], [place] [address]    CULT  03/08/2022 2320    NO GROWTH 1 DAY Performed at [facility], [place] [address], [place], [place] [address]    REPTSTATUS PENDING 03/08/2022 2320  REPTSTATUS PENDING 03/08/2022 2320     Radiological Exams on Admission: CT Chest Wo Contrast  Result Date: 03/10/2022 CLINICAL DATA:  Respiratory illness.  Cough for 6 days. EXAM: CT CHEST WITHOUT CONTRAST TECHNIQUE: Multidetector CT imaging of the chest was performed following the standard protocol without IV contrast. RADIATION DOSE REDUCTION: This exam was performed according to the departmental dose-optimization program which includes automated exposure control, adjustment of the mA and/or kV according to patient size and/or use of iterative reconstruction technique. COMPARISON:  Chest radiograph 03/10/2022 FINDINGS:  Cardiovascular: Normal heart size. Lad coronary artery calcifications. Trace anterior pericardial fluid. Mediastinum/Nodes: No enlarged mediastinal or axillary lymph nodes. Thyroid gland, trachea, and esophagus demonstrate no significant findings. Lungs/Pleura: No pleural fluid. There is diffuse bronchial wall thickening noted. Central airways appear patent. There is diffuse bronchial wall thickening. Bilateral lower lobe airspace disease is noted, right greater than left. Subsegmental atelectasis noted in the right middle lobe. No suspicious pulmonary nodule or mass identified at this time. Upper Abdomen: No acute abnormality. Musculoskeletal: No chest wall mass or suspicious bone lesions identified. Multilevel thoracic degenerative disc disease. IMPRESSION: 1. Bilateral lower lobe airspace disease, right greater than left, compatible with pneumonia. Follow-up imaging advised to ensure complete resolution and to rule out underlying malignancy. 2. Diffuse bronchial wall thickening compatible with bronchitis. 3. Right middle lobe subsegmental atelectasis. 4. Coronary artery calcifications. Electronically Signed   By: Kerby Moors M.D.   On: 03/10/2022 18:10   DG Chest 2 View  Result Date: 03/10/2022 CLINICAL DATA:  Worsening cough.  RSV 8 days ago. EXAM: CHEST - 2 VIEW COMPARISON:  None Available. FINDINGS: No pneumothorax or effusion. Normal cardiopericardial silhouette without view. Focal consolidation along the left lower lobe posteromedial. Possible infiltrate. Recommend follow-up to confirm clearance. Degenerative changes along the spine IMPRESSION: Focal opacity along the inferior medial left lower lobe. Possible infiltrate. Recommend follow-up Electronically Signed   By: Jill Side M.D.   On: 03/10/2022 12:29   _______________________________________________________________________________________________________ Latest  Blood pressure 125/79, pulse 95, temperature 99 F (37.2 C), temperature source  Oral, resp. rate 20, height _0  (1.753 m), weight 83.9 kg, SpO2 95 %.   Vitals  labs and radiology finding personally reviewed  Review of Systems:    Pertinent positives include:  Fevers, chills, fatigue,  shortness of breath at rest  dyspnea on exertion, Constitutional:  No weight loss, night sweats,  weight loss  HEENT:  No headaches, Difficulty swallowing,Tooth/dental problems,Sore throat,  No sneezing, itching, ear ache, nasal congestion, post nasal drip,  Cardio-vascular:  No chest pain, Orthopnea, PND, anasarca, dizziness, palpitations.no Bilateral lower extremity swelling  GI:  No heartburn, indigestion, abdominal pain, nausea, vomiting, diarrhea, change in bowel habits, loss of appetite, melena, blood in stool, hematemesis Resp:  no No excess mucus, no productive cough, No non-productive cough, No coughing up of blood.No change in color of mucus.No wheezing. Skin:  no rash or lesions. No jaundice GU:  no dysuria, change in color of urine, no urgency or frequency. No straining to urinate.  No flank pain.  Musculoskeletal:  No joint pain or no joint swelling. No decreased range of motion. No back pain.  Psych:  No change in mood or affect. No depression or anxiety. No memory loss.  Neuro: no localizing neurological complaints, no tingling, no weakness, no double vision, no gait abnormality, no slurred speech, no confusion  All systems reviewed and apart from [place] all are negative _______________________________________________________________________________________________ Past Medical History:   Past Medical History:  Diagnosis Date   Allergic rhinitis    Allergy    Arthritis    Plantar fasciitis, left       Past Surgical History:  Procedure Laterality Date   APPENDECTOMY     TONSILLECTOMY      Social History:  Ambulatory   independently        reports that he has never smoked. He has never used smokeless tobacco. He reports that he does not drink alcohol  and does not use drugs.   Family History:  Family History  Problem Relation Age of Onset   Arthritis Father    Cancer Father    Allergic rhinitis Father    Asthma Father    ______________________________________________________________________________________________ Allergies: No Known Allergies   Prior to Admission medications   Medication Sig Start Date End Date Taking? Authorizing Provider  oxymetazoline (AFRIN) 0.05 % nasal spray Spray 2 sprays twice a day by intranasal route.    [provider]  SILDENAFIL CITRATE PO Take by mouth as needed.    [provider]    ___________________________________________________________________________________________________ Physical Exam:    03/10/2022   11:39 PM 03/10/2022   10:19 PM 03/10/2022   10:00 PM  Vitals with BMI  Systolic 086  578  Diastolic 79  67  Pulse 95 104 105    1. General:  in No  Acute distress   Chronically ill    -appearing 2. Psychological: Alert and   Oriented 3. Head/ENT:    Dry Mucous Membranes                          Head Non traumatic, neck supple                          Poor Dentition 4. SKIN: decreased Skin turgor,  Skin clean Dry and intact no rash 5. Heart: Regular rate and rhythm no  Murmur, no Rub or gallop 6. Lungs: some wheezes or crackles   7. Abdomen: Soft,  non-tender, Non distended bowel sounds present 8. Lower extremities: no clubbing, cyanosis, no  edema 9. Neurologically Grossly intact, moving all 4 extremities equally   10. MSK: Normal range of motion    Chart has been reviewed  ______________________________________________________________________________________________  Assessment/Plan  72 y.o. male with medical history significant of HLD, prostate cancer   Admitted for  Pneumonia of both lower lobes due to infectious organism  Hypoxia, reactive airway disease   Present on Admission:  RSV (respiratory syncytial virus pneumonia)  Sepsis ([place])  Acute  respiratory failure with hypoxia ([place])  CAP (community acquired pneumonia)     RSV (respiratory syncytial virus pneumonia) Supportive measures treat underlying superimposed bacterial pneumonia Given reactive airway contribution will add albuterol as needed and DuoNeb Suspect postviral bacterial pneumonia will continue Rocephin azithromycin for now check procalcitonin  Sepsis ([place])  -SIRS criteria met with     tachycardia   ,   fever   RR >20 Today's Vitals   03/10/22 2219 03/10/22 2219 03/10/22 2219 03/10/22 2339  BP:    125/79  Pulse: (!) 104   95  Resp:   20 20  Temp:  99.4 F (37.4 C)  99 F (37.2 C)  TempSrc:  Oral  Oral  SpO2: 92%   95%  Weight:      Height:      PainSc:    0-No pain      The recent clinical data is  shown below. Vitals:   03/10/22 2219 03/10/22 2219 03/10/22 2219 03/10/22 2339  BP:    125/79  Pulse: (!) 104   95  Resp:   20 20  Temp:  99.4 F (37.4 C)  99 F (37.2 C)  TempSrc:  Oral  Oral  SpO2: 92%   95%  Weight:      Height:           -Most likely source being:  pulmonary, viral,   Patient meeting criteria for Severe sepsis with    evidence of end organ damage/organ dysfunction such as      elevated lactic acid >2     Component Value Date/Time   LATICACIDVEN 2.9 ([place]) 03/10/2022 2047   Acute hypoxia requiring new supplemental oxygen, SpO2: 95 % O2 Flow Rate (L/min): 2 L/min     - Obtain serial lactic acid and procalcitonin level.  - Initiated IV antibiotics in ER: Antibiotics Given (last 72 hours)     Date/Time Action Medication Dose Rate   03/10/22 1932 New Bag/Given   cefTRIAXone (ROCEPHIN) 2 g in sodium chloride 0.9 % 100 mL IVPB 2 g 200 mL/hr   03/10/22 2002 Given   doxycycline (VIBRA-TABS) tablet 100 mg 100 mg        Will continue  on : Rocephin and azithromycin   - await results of blood and urine culture  - Rehydrate aggressively  Intravenous fluids were administered,       12:02 AM   Acute respiratory  failure with hypoxia (HCC)  this patient has acute respiratory failure with Hypoxia as documented by the presence of following: O2 saturatio< 90% on RA    Likely due to:   Pneumonia,  RSV Provide O2 therapy and titrate as needed  Continuous pulse ox   check Pulse ox with ambulation prior to discharge   may need  TC consult for home O2 set up    flutter valve ordered   CAP (community acquired pneumonia) Continue Rocephin azithromycin Likely postviral pneumonia in the setting of recent RSV infection Evidence of cough productive of green sputum, fever chest imaging with bilateral infiltrates right worse than left and acute hypoxia all indicative of diagnosis of pneumonia   Other plan as per orders.  DVT prophylaxis:  SCD     Code Status:    Code Status: Not on file FULL CODE  as per patient   I had personally discussed CODE STATUS with patient    Family Communication:   Family not at  Bedside    Disposition Plan:       To home once workup is complete and patient is stable   Following barriers for discharge:                                                           Afebrile, white count improving able to transition to PO antibiotics                             Consults called: none   Admission status:  ED Disposition     ED Disposition  Admit   Condition  --   [place]: [place] [[id number]]  Level of Care: Med-Surg [16]  Interfacility transfer: Yes  May place patient in observation at [facility] or [place] if equivalent level of care is available:: Yes  Covid Evaluation: Asymptomatic - no recent exposure (last 10 days) testing not required  Diagnosis: RSV (respiratory syncytial virus pneumonia) [121624]  Admitting Physician: Dory Horn [[id number]]  Attending Physician: Dory Horn [[id number]]           Obs     Level of care      medical floor        Lab Results  Component Value Date   [place]  03/08/2022     Precautions: admitted as   Covid Negative   Maurice [name] 03/11/2022, 1:02 AM    [facility]     after 2 AM please page floor coverage PA If 7AM-7PM, please contact the day team taking care of the patient using [email or web address]   Patient was evaluated in the context of the global COVID-19 pandemic, which necessitated consideration that the patient might be at risk for infection with the SARS-CoV-2 virus that causes COVID-19. Institutional protocols and algorithms that pertain to the evaluation of patients at risk for COVID-19 are in a state of rapid change based on information released by regulatory bodies including the CDC and federal and state organizations. These policies and algorithms were followed during the patient's care.

## 2022-03-10 NOTE — ED Provider Triage Note (Signed)
Emergency Medicine Provider Triage Evaluation Note  Maurice [name] , [name] 72 y.o. male  was evaluated in triage.  Pt complains of concerns for cough onset 6 days. Pt was seen in the ED on 03/08/22 for similar symptoms. No Rx given. Pt was diagnosed with RSV and told to come back if symptoms persists. Has tried tylenol and ibuprofen at home for his fever. No history of asthma, COPD, or CHF. No chest pain. Notes that his cough has worsened.  Review of Systems  Positive:  Negative:   Physical Exam  BP (!) 146/79   Pulse 88   Temp 99.3 F (37.4 C)   Resp 20   Ht '5\' 9"'$  (1.753 m)   Wt 83.9 kg   SpO2 96%   BMI 27.31 kg/m  Gen:   Awake, no distress   Resp:  Normal effort  MSK:   Moves extremities without difficulty  Other:  Rhonchi noted throughout lung fields.   Medical Decision Making  Medically screening exam initiated at 12:02 PM.  Appropriate orders placed.  Maurice [name] was informed that the remainder of the evaluation will be completed by another provider, this initial triage assessment does not replace that evaluation, and the importance of remaining in the ED until their evaluation is complete.     Maurice [name] A, PA-C 03/10/22 1205

## 2022-03-10 NOTE — Plan of Care (Signed)
   Patient Name: Maurice [name], balding DOB: 07/29/1950 MRN: [id number] Transferring facility: [facility] Requesting provider: pfeiffer, md Reason for transfer: RSV pneumonia, hypoxia 72 yo WM with +RSV and bilateral pneumonia, hypoxic 3L/min. WBC 8.0. lactic acid pending. febrile to 102.6. EDP gave rocephin/zithromax. had blood cx on 03-08-2022 during ED visit when he was dx'd with RSV Going to: [facility] Admission Status: observation Bed Type: med/surg To Do:  [facility] will assume care on arrival to accepting facility. Until arrival, care as per EDP. However, [facility] available 24/7 for questions and assistance.   Nursing staff please page [place] and Consults [phone]) as soon as the patient arrives to the hospital.  Kristopher Oppenheim, DO [facility]

## 2022-03-10 NOTE — H&P (Incomplete)
Maurice [name] HYW:[id number] DOB: [birth year] DOA: 03/10/2022     PCP: [name], MD   Outpatient Specialists:   Radiology oncology Dr. Tammi Klippel Patient arrived to ER on 03/10/22 at 1126 Referred by Attending Kristopher Oppenheim, DO   Patient coming from:    home Lives alone,   *** With family   Chief Complaint:  Chief Complaint  Patient presents with  . Cough    HPI: Maurice [name] is a 72 y.o. male with medical history significant of HLD, prostate cancer    Presented with worsening cough 6 days cough he went to the emergency department 2 days ago and was diagnosed with RSV but continued to have worsening shortness of breath and wheezing. Has been febrile up to 102 has been taking ibuprofen and Tylenol at home but not improving now cough is productive of green sputum he has been having trouble sleeping at night because he has trouble coughing no vomiting no abdominal pain patient On arrival temperature 100.9 pulse 100 Given albuterol nebulizers and DuoNeb At home noted to be hypoxic down to 88% on room air  Initial COVID TEST  NEGATIVE Lab Results  Component Value Date   [place] NEGATIVE 03/08/2022     Regarding pertinent Chronic problems:   ****Hyperlipidemia - *on statins {statin:315258}  Lipid Panel  No results found for: "CHOL", "TRIG", "HDL", "CHOLHDL", "VLDL", "LDLCALC", "LDLDIRECT", "LABVLDL"      *** COPD - not **followed by pulmonology *** not  on baseline oxygen  *L,    *** OSA -on nocturnal oxygen, *CPAP, *noncompliant with CPAP  *** Hx of CVA - *with/out residual deficits on Aspirin 81 mg, 325, Plavix  ***A. Fib -  - CHA2DS2 vas score **** CHA2DS2/VAS Stroke Risk Points      N/A >= 2 Points: High Risk  1 - 1.99 Points: Medium Risk  0 Points: Low Risk    Last Change: N/A      This score determines the patient's risk of having a stroke if the  patient has atrial fibrillation.      This score is not applicable to this patient. Components are not   calculated.     current  on anticoagulation with ****Coumadin  ***Xarelto,* Eliquis,  *** Not on anticoagulation secondary to Risk of Falls, *** recurrent bleeding         -  Rate control:  Currently controlled with ***Toprolol,  *Metoprolol,* Diltiazem, *Coreg          - Rhythm control: *** amiodarone, *flecainide     While in ER:   Sepsis found to have pneumonia bilateral right greater than left started on Rocephin azithromycin also noted to be hypoxic 89 to 90% on room air needing up to 3 L of oxygen    CXR - Focal opacity along the inferior medial left lower lobe. Possible infiltrate. Recommend follow-up   CT  chest -  Bilateral lower lobe airspace disease, right greater than left, compatible with pneumonia.  Following Medications were ordered in ER: Medications  albuterol (VENTOLIN HFA) 108 (90 Base) MCG/ACT inhaler 2 puff (2 puffs Inhalation Given 03/10/22 1652)  HYDROcodone bit-homatropine (HYCODAN) 5-1.5 MG/5ML syrup 5 mL (0 mLs Oral Hold 03/10/22 1937)  acetaminophen (TYLENOL) tablet 1,000 mg (1,000 mg Oral Given 03/10/22 1720)  ipratropium-albuterol (DUONEB) 0.5-2.5 (3) MG/3ML nebulizer solution 3 mL (3 mLs Nebulization Given 03/10/22 1818)  benzonatate (TESSALON) capsule 200 mg (200 mg Oral Given 03/10/22 1814)  cefTRIAXone (ROCEPHIN) 2 g in sodium chloride 0.9 % 100  mL IVPB (0 g Intravenous Stopped 03/10/22 2004)  doxycycline (VIBRA-TABS) tablet 100 mg (100 mg Oral Given 03/10/22 2002)  ipratropium-albuterol (DUONEB) 0.5-2.5 (3) MG/3ML nebulizer solution 3 mL (3 mLs Nebulization Given 03/10/22 2042)  ibuprofen (ADVIL) tablet 600 mg (600 mg Oral Given 03/10/22 2037)  HYDROcodone-acetaminophen (NORCO/VICODIN) 5-325 MG per tablet 1 tablet (1 tablet Oral Given 03/10/22 2037)       ED Triage Vitals  Enc Vitals Group     BP 03/10/22 1155 (!) 173/89     Pulse Rate 03/10/22 1155 91     Resp 03/10/22 1155 20     Temp 03/10/22 1155 99.3 F (37.4 C)     Temp Source 03/10/22 1646 Oral      SpO2 03/10/22 1155 96 %     Weight 03/10/22 1154 184 lb 15.5 oz (83.9 kg)     Height 03/10/22 1154 '5\' 9"'$  (1.753 m)     Head Circumference --      Peak Flow --      Pain Score 03/10/22 2339 0     Pain Loc --      Pain Edu? --      Excl. in [place]? --   TMAX(24)@     _________________________________________ Significant initial  Findings: Abnormal Labs Reviewed  BASIC METABOLIC PANEL - Abnormal; Notable for the following components:      Result Value   Glucose, Bld 101 (*)    All other components within normal limits  LACTIC ACID, PLASMA - Abnormal; Notable for the following components:   Lactic Acid, Venous 2.9 (*)    All other components within normal limits     _________________________ Troponin ***ordered ECG: Ordered Personally reviewed and interpreted by me showing: HR : *** Rhythm: *NSR, Sinus tachycardia * A.fib. W RVR, RBBB, LBBB, Paced Ischemic changes*nonspecific changes, no evidence of ischemic changes QTC*   ____________________ This patient meets SIRS Criteria and may be septic.    The recent clinical data is shown below. Vitals:   03/10/22 2219 03/10/22 2219 03/10/22 2219 03/10/22 2339  BP:    125/79  Pulse: (!) 104   95  Resp:   20 20  Temp:  99.4 F (37.4 C)  99 F (37.2 C)  TempSrc:  Oral  Oral  SpO2: 92%   95%  Weight:      Height:         WBC     Component Value Date/Time   WBC 8.0 03/10/2022 1205   LYMPHSABS 1.2 03/10/2022 1205   MONOABS 0.7 03/10/2022 1205   EOSABS 0.1 03/10/2022 1205   BASOSABS 0.0 03/10/2022 1205     Lactic Acid, Venous    Component Value Date/Time   LATICACIDVEN 2.9 (HH) 03/10/2022 2047     Procalcitonin *** Ordered Lactic Acid, Venous    Component Value Date/Time   LATICACIDVEN 2.9 (HH) 03/10/2022 2047       UA *** no evidence of UTI  ***Pending ***not ordered   Urine analysis: No results found for: "COLORURINE", "APPEARANCEUR", "LABSPEC", "PHURINE", "GLUCOSEU", "HGBUR", "BILIRUBINUR", "KETONESUR",  "PROTEINUR", "UROBILINOGEN", "NITRITE", "LEUKOCYTESUR"  Results for orders placed or performed during the hospital encounter of 03/08/22  Resp panel by RT-PCR (RSV, Flu A&B, Covid) Anterior Nasal Swab     Status: Abnormal   Collection Time: 03/08/22 10:46 PM   Specimen: Anterior Nasal Swab  Result Value Ref Range Status   SARS Coronavirus 2 by RT PCR NEGATIVE NEGATIVE Final    Comment: (NOTE) SARS-CoV-2 target nucleic acids are  NOT DETECTED.  The SARS-CoV-2 RNA is generally detectable in upper respiratory specimens during the acute phase of infection. The lowest concentration of SARS-CoV-2 viral copies this assay can detect is 138 copies/mL. A negative result does not preclude SARS-Cov-2 infection and should not be used as the sole basis for treatment or other patient management decisions. A negative result may occur with  improper specimen collection/handling, submission of specimen other than nasopharyngeal swab, presence of viral mutation(s) within the areas targeted by this assay, and inadequate number of viral copies(<138 copies/mL). A negative result must be combined with clinical observations, patient history, and epidemiological information. The expected result is Negative.  Fact Sheet for Patients:  EntrepreneurPulse.com.au  Fact Sheet for Healthcare Providers:  IncredibleEmployment.be  This test is no t yet approved or cleared by the [place] FDA and  has been authorized for detection and/or diagnosis of SARS-CoV-2 by FDA under an Emergency Use Authorization (EUA). This EUA will remain  in effect (meaning this test can be used) for the duration of the COVID-19 declaration under Section 564(b)(1) of the Act, 21 U.S.C.section 360bbb-3(b)(1), unless the authorization is terminated  or revoked sooner.       Influenza A by PCR NEGATIVE NEGATIVE Final   Influenza B by PCR NEGATIVE NEGATIVE Final    Comment: (NOTE) The Xpert Xpress  SARS-CoV-2/FLU/RSV plus assay is intended as an aid in the diagnosis of influenza from Nasopharyngeal swab specimens and should not be used as a sole basis for treatment. Nasal washings and aspirates are unacceptable for Xpert Xpress SARS-CoV-2/FLU/RSV testing.  Fact Sheet for Patients: EntrepreneurPulse.com.au  Fact Sheet for Healthcare Providers: IncredibleEmployment.be  This test is not yet approved or cleared by the [place] FDA and has been authorized for detection and/or diagnosis of SARS-CoV-2 by FDA under an Emergency Use Authorization (EUA). This EUA will remain in effect (meaning this test can be used) for the duration of the COVID-19 declaration under Section 564(b)(1) of the Act, 21 U.S.C. section 360bbb-3(b)(1), unless the authorization is terminated or revoked.     Resp Syncytial Virus by PCR POSITIVE (A) NEGATIVE Final    Comment: (NOTE) Fact Sheet for Patients: EntrepreneurPulse.com.au  Fact Sheet for Healthcare Providers: IncredibleEmployment.be  This test is not yet approved or cleared by the [place] FDA and has been authorized for detection and/or diagnosis of SARS-CoV-2 by FDA under an Emergency Use Authorization (EUA). This EUA will remain in effect (meaning this test can be used) for the duration of the COVID-19 declaration under Section 564(b)(1) of the Act, 21 U.S.C. section 360bbb-3(b)(1), unless the authorization is terminated or revoked.  Performed at [facility], [place] [address] [place], [place] [address]   Blood Culture (routine x 2)     Status: None (Preliminary result)   Collection Time: 03/08/22 11:20 PM   Specimen: BLOOD  Result Value Ref Range Status   Specimen Description   Final    BLOOD BLOOD LEFT HAND Performed at [place] [address], [place], [place] [address]    Special Requests   Final    BOTTLES  DRAWN AEROBIC AND ANAEROBIC Blood Culture adequate volume Performed at [place] [address], [place], [place] [address]    Culture   Final    NO GROWTH 1 DAY Performed at [facility], [place] [address] [place], [place] [address]    Report Status PENDING  Incomplete  Blood Culture (routine x 2)     Status:  None (Preliminary result)   Collection Time: 03/08/22 11:20 PM   Specimen: BLOOD  Result Value Ref Range Status   Specimen Description   Final    BLOOD LEFT ANTECUBITAL Performed at [place] [address], [place], [place] [address]    Special Requests   Final    BOTTLES DRAWN AEROBIC AND ANAEROBIC Blood Culture adequate volume Performed at [place] [address], [place], [place] [address]    Culture   Final    NO GROWTH 1 DAY Performed at [facility], [place] [address], [place], [place] [address]    Report Status PENDING  Incomplete     _______________________________________________ Hospitalist was called for admission for *** Pneumonia of both lower lobes due to infectious organism ***  Hypoxia ***  Wheeze ***    The following Work up has been ordered so far:  Orders Placed This Encounter  Procedures  . Culture, blood (routine x 2)  . DG Chest 2 View  . CT Chest Wo Contrast  . Basic metabolic panel  . CBC with Differential  . Lactic acid, plasma  . Nursing communication  . Consult to hospitalist  . Incentive spirometry RT  . Flutter valve  . Place in observation (patient's expected length of stay will be less than 2 midnights)     OTHER Significant initial  Findings:  labs showing:    Recent Labs  Lab 03/08/22 2320 03/10/22 1205  NA 134* 136  K 3.8 3.5  CO2 21* 22  GLUCOSE 149* 101*  BUN 21 12  CREATININE 1.14 0.80  CALCIUM 8.5* 9.1    Cr  * stable,  Up from baseline see below Lab Results  Component Value Date   CREATININE 0.80 03/10/2022    CREATININE 1.14 03/08/2022   CREATININE 0.88 03/24/2016    Recent Labs  Lab 03/08/22 2320  AST 44*  ALT 52*  ALKPHOS 54  BILITOT 0.5  PROT 6.8  ALBUMIN 3.8   Lab Results  Component Value Date   CALCIUM 9.1 03/10/2022          Plt: Lab Results  Component Value Date   PLT 203 03/10/2022       COVID-19 Labs  No results for input(s): "DDIMER", "FERRITIN", "LDH", "CRP" in the last 72 hours.  Lab Results  Component Value Date   [place] NEGATIVE 03/08/2022     Arterial ***Venous  Blood Gas result:  pH *** pCO2 ***; pO2 ***;     %O2 Sat ***.  ABG No results found for: "PHART", "PCO2ART", "PO2ART", "HCO3", "TCO2", "ACIDBASEDEF", "O2SAT"       Recent Labs  Lab 03/08/22 2320 03/10/22 1205  WBC 11.3* 8.0  NEUTROABS 9.3* 6.0  HGB 15.2 13.9  HCT 45.2 40.1  MCV 95.2 92.6  PLT 189 203    HG/HCT * stable,  Down *Up from baseline see below    Component Value Date/Time   HGB 13.9 03/10/2022 1205   HCT 40.1 03/10/2022 1205   MCV 92.6 03/10/2022 1205      No results for input(s): "LIPASE", "AMYLASE" in the last 168 hours. No results for input(s): "AMMONIA" in the last 168 hours.    Cardiac Panel (last 3 results) No results for input(s): "CKTOTAL", "CKMB", "TROPONINI", "RELINDX" in the last 72 hours.  .car BNP (last 3 results) No results for input(s): "BNP" in the last 8760 hours.    DM  labs:  HbA1C: No results for input(s): "HGBA1C" in the  last 8760 hours.     CBG (last 3)  No results for input(s): "GLUCAP" in the last 72 hours.        Cultures:    Component Value Date/Time   SDES  03/08/2022 2320    BLOOD BLOOD LEFT HAND Performed at [facility], [place] [address] [place], [place] [address]    SDES  03/08/2022 2320    BLOOD LEFT ANTECUBITAL Performed at [facility], [place] [address] [place], [place] [address]    SPECREQUEST  03/08/2022 2320    BOTTLES DRAWN AEROBIC AND ANAEROBIC Blood Culture  adequate volume Performed at [facility], [place] [address] [place], [place] [address]    SPECREQUEST  03/08/2022 2320    BOTTLES DRAWN AEROBIC AND ANAEROBIC Blood Culture adequate volume Performed at [facility], [place] [address], [place], [place] [address]    CULT  03/08/2022 2320    NO GROWTH 1 DAY Performed at [place] [address], [place], [place] [address]    CULT  03/08/2022 2320    NO GROWTH 1 DAY Performed at [facility], [place] [address] [place],  [address]    REPTSTATUS PENDING 03/08/2022 2320   REPTSTATUS PENDING 03/08/2022 2320     Radiological Exams on Admission: CT Chest Wo Contrast  Result Date: 03/10/2022 CLINICAL DATA:  Respiratory illness.  Cough for 6 days. EXAM: CT CHEST WITHOUT CONTRAST TECHNIQUE: Multidetector CT imaging of the chest was performed following the standard protocol without IV contrast. RADIATION DOSE REDUCTION: This exam was performed according to the departmental dose-optimization program which includes automated exposure control, adjustment of the mA and/or kV according to patient size and/or use of iterative reconstruction technique. COMPARISON:  Chest radiograph 03/10/2022 FINDINGS: Cardiovascular: Normal heart size. Lad coronary artery calcifications. Trace anterior pericardial fluid. Mediastinum/Nodes: No enlarged mediastinal or axillary lymph nodes. Thyroid gland, trachea, and esophagus demonstrate no significant findings. Lungs/Pleura: No pleural fluid. There is diffuse bronchial wall thickening noted. Central airways appear patent. There is diffuse bronchial wall thickening. Bilateral lower lobe airspace disease is noted, right greater than left. Subsegmental atelectasis noted in the right middle lobe. No suspicious pulmonary nodule or mass identified at this time. Upper Abdomen: No acute abnormality. Musculoskeletal: No chest wall mass or suspicious bone lesions identified. Multilevel  thoracic degenerative disc disease. IMPRESSION: 1. Bilateral lower lobe airspace disease, right greater than left, compatible with pneumonia. Follow-up imaging advised to ensure complete resolution and to rule out underlying malignancy. 2. Diffuse bronchial wall thickening compatible with bronchitis. 3. Right middle lobe subsegmental atelectasis. 4. Coronary artery calcifications. Electronically Signed   By: Kerby Moors M.D.   On: 03/10/2022 18:10   DG Chest 2 View  Result Date: 03/10/2022 CLINICAL DATA:  Worsening cough.  RSV 8 days ago. EXAM: CHEST - 2 VIEW COMPARISON:  None Available. FINDINGS: No pneumothorax or effusion. Normal cardiopericardial silhouette without view. Focal consolidation along the left lower lobe posteromedial. Possible infiltrate. Recommend follow-up to confirm clearance. Degenerative changes along the spine IMPRESSION: Focal opacity along the inferior medial left lower lobe. Possible infiltrate. Recommend follow-up Electronically Signed   By: Jill Side M.D.   On: 03/10/2022 12:29   _______________________________________________________________________________________________________ Latest  Blood pressure 125/79, pulse 95, temperature 99 F (37.2 C), temperature source Oral, resp. rate 20, height '5\' 9"'$  (1.753 m), weight 83.9 kg, SpO2 95 %.   Vitals  labs and radiology finding personally reviewed  Review of Systems:  Pertinent positives include: ***  Constitutional:  No weight loss, night sweats, Fevers, chills, fatigue, weight loss  HEENT:  No headaches, Difficulty swallowing,Tooth/dental problems,Sore throat,  No sneezing, itching, ear ache, nasal congestion, post nasal drip,  Cardio-vascular:  No chest pain, Orthopnea, PND, anasarca, dizziness, palpitations.no Bilateral lower extremity swelling  GI:  No heartburn, indigestion, abdominal pain, nausea, vomiting, diarrhea, change in bowel habits, loss of appetite, melena, blood in stool, hematemesis Resp:  no  shortness of breath at rest. No dyspnea on exertion, No excess mucus, no productive cough, No non-productive cough, No coughing up of blood.No change in color of mucus.No wheezing. Skin:  no rash or lesions. No jaundice GU:  no dysuria, change in color of urine, no urgency or frequency. No straining to urinate.  No flank pain.  Musculoskeletal:  No joint pain or no joint swelling. No decreased range of motion. No back pain.  Psych:  No change in mood or affect. No depression or anxiety. No memory loss.  Neuro: no localizing neurological complaints, no tingling, no weakness, no double vision, no gait abnormality, no slurred speech, no confusion  All systems reviewed and apart from [place] all are negative _______________________________________________________________________________________________ Past Medical History:   Past Medical History:  Diagnosis Date  . Allergic rhinitis   . Allergy   . Arthritis   . Plantar fasciitis, left       Past Surgical History:  Procedure Laterality Date  . APPENDECTOMY    . TONSILLECTOMY      Social History:  Ambulatory *** independently cane, walker  wheelchair bound, bed bound     reports that he has never smoked. He has never used smokeless tobacco. He reports that he does not drink alcohol and does not use drugs.     Family History: *** Family History  Problem Relation Age of Onset  . Arthritis Father   . Cancer Father   . Allergic rhinitis Father   . Asthma Father    ______________________________________________________________________________________________ Allergies: No Known Allergies   Prior to Admission medications   Medication Sig Start Date End Date Taking? Authorizing Provider  oxymetazoline (AFRIN) 0.05 % nasal spray Spray 2 sprays twice a day by intranasal route.    [provider]  SILDENAFIL CITRATE PO Take by mouth as needed.    [provider]     ___________________________________________________________________________________________________ Physical Exam:    03/10/2022   11:39 PM 03/10/2022   10:19 PM 03/10/2022   10:00 PM  Vitals with BMI  Systolic 191  478  Diastolic 79  67  Pulse 95 104 105     1. General:  in No ***Acute distress***increased work of breathing ***complaining of severe pain****agitated * Chronically ill *well *cachectic *toxic acutely ill -appearing 2. Psychological: Alert and *** Oriented 3. Head/ENT:   Moist *** Dry Mucous Membranes                          Head Non traumatic, neck supple                          Normal *** Poor Dentition 4. SKIN: normal *** decreased Skin turgor,  Skin clean Dry and intact no rash 5. Heart: Regular rate and rhythm no*** Murmur, no Rub or gallop 6. Lungs: ***Clear to auscultation bilaterally, no wheezes or crackles   7. Abdomen: Soft, ***non-tender, Non distended *** obese ***bowel sounds present 8. Lower extremities: no clubbing, cyanosis, no ***  edema 9. Neurologically Grossly intact, moving all 4 extremities equally *** strength 5 out of 5 in all 4 extremities cranial nerves II through XII intact 10. MSK: Normal range of motion    Chart has been reviewed  ______________________________________________________________________________________________  Assessment/Plan  ***  Admitted for *** Pneumonia of both lower lobes due to infectious organism ***  Hypoxia ***  Wheeze ***    Present on Admission: . RSV (respiratory syncytial virus pneumonia)     No problem-specific Assessment & Plan notes found for this encounter.    Other plan as per orders.  DVT prophylaxis:  SCD *** Lovenox       Code Status:    Code Status: Not on file FULL CODE *** DNR/DNI ***comfort care as per patient ***family  I had personally discussed CODE STATUS with patient and family* I had spent *min discussing goals of care and CODE STATUS    Family Communication:    Family not at  Bedside  plan of care was discussed on the phone with *** Son, Daughter, Wife, Husband, Sister, Brother , father, mother  Disposition Plan:   *** likely will need placement for rehabilitation                          Back to current facility when stable                            To home once workup is complete and patient is stable  ***Following barriers for discharge:                            Electrolytes corrected                               Anemia corrected                             Pain controlled with PO medications                               Afebrile, white count improving able to transition to PO antibiotics                             Will need to be able to tolerate PO                            Will likely need home health, home O2, set up                           Will need consultants to evaluate patient prior to discharge  ****EXPECT DC tomorrow                    ***Would benefit from PT/OT eval prior to DC  Ordered                   Swallow eval - SLP ordered                   Diabetes care coordinator  Transition of care consulted                   Nutrition    consulted                  Wound care  consulted                   Palliative care    consulted                   Behavioral health  consulted                    Consults called: ***    Admission status:  ED Disposition     ED Disposition  Admit   Condition  --   [place]: [place] [100100]  Level of Care: Med-Surg [16]  Interfacility transfer: Yes  May place patient in observation at [facility] or [facility] if equivalent level of care is available:: Yes  Covid Evaluation: Asymptomatic - no recent exposure (last 10 days) testing not required  Diagnosis: RSV (respiratory syncytial virus pneumonia) [[id number]]  Admitting Physician: Dory Horn [[id number]]  Attending Physician: Dory Horn [[id number]]            Obs***  ***  inpatient     I Expect 2 midnight stay secondary to severity of patient's current illness need for inpatient interventions justified by the following: ***hemodynamic instability despite optimal treatment (tachycardia *hypotension * tachypnea *hypoxia, hypercapnia) * Severe lab/radiological/exam abnormalities including:     and extensive comorbidities including: *substance abuse  *Chronic pain *DM2  * CHF * CAD  * COPD/asthma *Morbid Obesity * CKD *dementia *liver disease *history of stroke with residual deficits *  malignancy, * sickle cell disease  History of amputation Chronic anticoagulation  That are currently affecting medical management.   I expect  patient to be hospitalized for 2 midnights requiring inpatient medical care.  Patient is at high risk for adverse outcome (such as loss of life or disability) if not treated.  Indication for inpatient stay as follows:  Severe change from baseline regarding mental status Hemodynamic instability despite maximal medical therapy,  ongoing suicidal ideations,  severe pain requiring acute inpatient management,  inability to maintain oral hydration   persistent chest pain despite medical management Need for operative/procedural  intervention New or worsening hypoxia   Need for IV antibiotics, IV fluids, IV rate controling medications, IV antihypertensives, IV pain medications, IV anticoagulation, need for biPAP    Level of care   *** tele  For 12H 24H     medical floor       progressive tele indefinitely please discontinue once patient no longer qualifies COVID-19 Labs    Lab Results  Component Value Date   [place] NEGATIVE 03/08/2022     Precautions: admitted as *** Covid Negative  ***asymptomatic screening protocol****PUI *** covid positive No active isolations ***If Covid PCR is negative  - please DC precautions - would need additional investigation given very high risk for false native  test result    Critical***  Patient is critically ill due to  hemodynamic instability * respiratory failure *severe sepsis* ongoing chest pain*  They are at high risk for life/limb threatening clinical deterioration requiring frequent reassessment and modifications of care.  Services provided include examination of the patient, review of relevant ancillary tests, prescription of lifesaving therapies, review of medications and prophylactic therapy.  Total critical care time  excluding separately billable procedures: 60*  Minutes.    Mylinh Cragg 03/10/2022, 11:47 PM ***  [facility]     after 2 AM please page floor coverage PA If 7AM-7PM, please contact the day team taking care of the patient using [email or web address]   Patient was evaluated in the context of the global COVID-19 pandemic, which necessitated consideration that the patient might be at risk for infection with the SARS-CoV-2 virus that causes COVID-19. Institutional protocols and algorithms that pertain to the evaluation of patients at risk for COVID-19 are in a state of rapid change based on information released by regulatory bodies including the CDC and federal and state organizations. These policies and algorithms were followed during the patient's care.

## 2022-03-10 NOTE — Subjective & Objective (Addendum)
6 days cough he went to the emergency department 2 days ago and was diagnosed with RSV but continued to have worsening shortness of breath and wheezing. Has been febrile up to 102 has been taking ibuprofen and Tylenol at home but not improving now cough is productive of green sputum he has been having trouble sleeping at night because he has trouble coughing no vomiting no abdominal pain patient On arrival temperature 100.9 pulse 100 Given albuterol nebulizers and DuoNeb

## 2022-03-11 ENCOUNTER — Other Ambulatory Visit: Payer: Self-pay

## 2022-03-11 ENCOUNTER — Encounter (HOSPITAL_COMMUNITY): Payer: Self-pay | Admitting: Internal Medicine

## 2022-03-11 DIAGNOSIS — R062 Wheezing: Secondary | ICD-10-CM | POA: Diagnosis present

## 2022-03-11 DIAGNOSIS — A419 Sepsis, unspecified organism: Secondary | ICD-10-CM | POA: Diagnosis not present

## 2022-03-11 DIAGNOSIS — J40 Bronchitis, not specified as acute or chronic: Secondary | ICD-10-CM | POA: Diagnosis present

## 2022-03-11 DIAGNOSIS — Z1152 Encounter for screening for COVID-19: Secondary | ICD-10-CM | POA: Diagnosis not present

## 2022-03-11 DIAGNOSIS — R6520 Severe sepsis without septic shock: Secondary | ICD-10-CM | POA: Diagnosis present

## 2022-03-11 DIAGNOSIS — J121 Respiratory syncytial virus pneumonia: Secondary | ICD-10-CM | POA: Diagnosis present

## 2022-03-11 DIAGNOSIS — E876 Hypokalemia: Secondary | ICD-10-CM | POA: Diagnosis present

## 2022-03-11 DIAGNOSIS — E871 Hypo-osmolality and hyponatremia: Secondary | ICD-10-CM | POA: Diagnosis present

## 2022-03-11 DIAGNOSIS — E872 Acidosis, unspecified: Secondary | ICD-10-CM | POA: Diagnosis present

## 2022-03-11 DIAGNOSIS — R0902 Hypoxemia: Secondary | ICD-10-CM

## 2022-03-11 DIAGNOSIS — C61 Malignant neoplasm of prostate: Secondary | ICD-10-CM

## 2022-03-11 DIAGNOSIS — Z825 Family history of asthma and other chronic lower respiratory diseases: Secondary | ICD-10-CM | POA: Diagnosis not present

## 2022-03-11 DIAGNOSIS — Z809 Family history of malignant neoplasm, unspecified: Secondary | ICD-10-CM | POA: Diagnosis not present

## 2022-03-11 DIAGNOSIS — E785 Hyperlipidemia, unspecified: Secondary | ICD-10-CM | POA: Diagnosis present

## 2022-03-11 DIAGNOSIS — J9601 Acute respiratory failure with hypoxia: Secondary | ICD-10-CM | POA: Diagnosis present

## 2022-03-11 DIAGNOSIS — J189 Pneumonia, unspecified organism: Secondary | ICD-10-CM | POA: Diagnosis present

## 2022-03-11 DIAGNOSIS — Z8546 Personal history of malignant neoplasm of prostate: Secondary | ICD-10-CM | POA: Diagnosis not present

## 2022-03-11 LAB — URINALYSIS, COMPLETE (UACMP) WITH MICROSCOPIC
Bilirubin Urine: NEGATIVE
Glucose, UA: NEGATIVE mg/dL
Hgb urine dipstick: NEGATIVE
Ketones, ur: 5 mg/dL — AB
Leukocytes,Ua: NEGATIVE
Nitrite: NEGATIVE
Protein, ur: 30 mg/dL — AB
Specific Gravity, Urine: 1.015 (ref 1.005–1.030)
pH: 5 (ref 5.0–8.0)

## 2022-03-11 LAB — BLOOD GAS, VENOUS
Acid-Base Excess: 0.3 mmol/L (ref 0.0–2.0)
Bicarbonate: 23.5 mmol/L (ref 20.0–28.0)
Drawn by: 65980
O2 Saturation: 99.2 %
Patient temperature: 37
pCO2, Ven: 33 mmHg — ABNORMAL LOW (ref 44–60)
pH, Ven: 7.46 — ABNORMAL HIGH (ref 7.25–7.43)
pO2, Ven: 88 mmHg — ABNORMAL HIGH (ref 32–45)

## 2022-03-11 LAB — PROCALCITONIN: Procalcitonin: 3.19 ng/mL

## 2022-03-11 LAB — STREP PNEUMONIAE URINARY ANTIGEN: Strep Pneumo Urinary Antigen: NEGATIVE

## 2022-03-11 LAB — CBC
HCT: 37.8 % — ABNORMAL LOW (ref 39.0–52.0)
Hemoglobin: 12.9 g/dL — ABNORMAL LOW (ref 13.0–17.0)
MCH: 31.9 pg (ref 26.0–34.0)
MCHC: 34.1 g/dL (ref 30.0–36.0)
MCV: 93.6 fL (ref 80.0–100.0)
Platelets: 187 10*3/uL (ref 150–400)
RBC: 4.04 MIL/uL — ABNORMAL LOW (ref 4.22–5.81)
RDW: 12.4 % (ref 11.5–15.5)
WBC: 5.4 10*3/uL (ref 4.0–10.5)
nRBC: 0 % (ref 0.0–0.2)

## 2022-03-11 LAB — COMPREHENSIVE METABOLIC PANEL
ALT: 35 U/L (ref 0–44)
AST: 31 U/L (ref 15–41)
Albumin: 2.6 g/dL — ABNORMAL LOW (ref 3.5–5.0)
Alkaline Phosphatase: 47 U/L (ref 38–126)
Anion gap: 11 (ref 5–15)
BUN: 13 mg/dL (ref 8–23)
CO2: 20 mmol/L — ABNORMAL LOW (ref 22–32)
Calcium: 8 mg/dL — ABNORMAL LOW (ref 8.9–10.3)
Chloride: 102 mmol/L (ref 98–111)
Creatinine, Ser: 0.98 mg/dL (ref 0.61–1.24)
GFR, Estimated: >60 mL/min (ref >60)
Glucose, Bld: 121 mg/dL — ABNORMAL HIGH (ref 70–99)
Potassium: 3.2 mmol/L — ABNORMAL LOW (ref 3.5–5.1)
Sodium: 133 mmol/L — ABNORMAL LOW (ref 135–145)
Total Bilirubin: 0.9 mg/dL (ref 0.3–1.2)
Total Protein: 5.9 g/dL — ABNORMAL LOW (ref 6.5–8.1)

## 2022-03-11 LAB — EXPECTORATED SPUTUM ASSESSMENT W GRAM STAIN, RFLX TO RESP C

## 2022-03-11 LAB — LACTIC ACID, PLASMA
Lactic Acid, Venous: 1.9 mmol/L (ref 0.5–1.9)
Lactic Acid, Venous: 1.2 mmol/L (ref 0.5–1.9)
Lactic Acid, Venous: 1.7 mmol/L (ref 0.5–1.9)

## 2022-03-11 LAB — MAGNESIUM: Magnesium: 1.6 mg/dL — ABNORMAL LOW (ref 1.7–2.4)

## 2022-03-11 LAB — CK: Total CK: 129 U/L (ref 49–397)

## 2022-03-11 LAB — TSH: TSH: 0.741 u[IU]/mL (ref 0.350–4.500)

## 2022-03-11 LAB — PHOSPHORUS: Phosphorus: 2 mg/dL — ABNORMAL LOW (ref 2.5–4.6)

## 2022-03-11 MED ORDER — SODIUM CHLORIDE 0.9 % IV SOLN
500.0000 mg | INTRAVENOUS | Status: DC
  Administered 2022-03-11 – 2022-03-12 (×2): 500 mg via INTRAVENOUS
  Filled 2022-03-11 (×2): qty 5

## 2022-03-11 MED ORDER — SODIUM CHLORIDE 0.9 % IV SOLN: INTRAVENOUS | Status: DC

## 2022-03-11 MED ORDER — GUAIFENESIN ER 600 MG PO TB12
600.0000 mg | ORAL_TABLET | Freq: Two times a day (BID) | ORAL | Status: DC
  Administered 2022-03-11 – 2022-03-12 (×3): 600 mg via ORAL
  Filled 2022-03-11 (×3): qty 1

## 2022-03-11 MED ORDER — SODIUM CHLORIDE 0.9 % IV SOLN
2.0000 g | INTRAVENOUS | Status: DC
  Administered 2022-03-11: 2 g via INTRAVENOUS
  Filled 2022-03-11: qty 20

## 2022-03-11 MED ORDER — METHYLPREDNISOLONE SODIUM SUCC 125 MG IJ SOLR
40.0000 mg | Freq: Two times a day (BID) | INTRAMUSCULAR | Status: DC
  Administered 2022-03-11 – 2022-03-12 (×2): 40 mg via INTRAVENOUS
  Filled 2022-03-11 (×2): qty 2

## 2022-03-11 MED ORDER — MAGNESIUM SULFATE 2 GM/50ML IV SOLN
2.0000 g | Freq: Once | INTRAVENOUS | Status: AC
  Administered 2022-03-11: 2 g via INTRAVENOUS
  Filled 2022-03-11: qty 50

## 2022-03-11 MED ORDER — ENOXAPARIN SODIUM 40 MG/0.4ML IJ SOSY
40.0000 mg | PREFILLED_SYRINGE | INTRAMUSCULAR | Status: DC
  Administered 2022-03-11: 40 mg via SUBCUTANEOUS
  Filled 2022-03-11: qty 0.4

## 2022-03-11 MED ORDER — ALBUTEROL SULFATE (2.5 MG/3ML) 0.083% IN NEBU: 2.5000 mg | INHALATION_SOLUTION | RESPIRATORY_TRACT | Status: DC | PRN

## 2022-03-11 MED ORDER — IPRATROPIUM-ALBUTEROL 0.5-2.5 (3) MG/3ML IN SOLN: 3.0000 mL | RESPIRATORY_TRACT | Status: DC | PRN

## 2022-03-11 MED ORDER — ACETAMINOPHEN 325 MG PO TABS
650.0000 mg | ORAL_TABLET | Freq: Four times a day (QID) | ORAL | Status: DC | PRN
  Administered 2022-03-11: 650 mg via ORAL
  Filled 2022-03-11: qty 2

## 2022-03-11 MED ORDER — GUAIFENESIN 100 MG/5ML PO LIQD
5.0000 mL | ORAL | Status: DC | PRN
  Administered 2022-03-11: 5 mL via ORAL
  Filled 2022-03-11: qty 15

## 2022-03-11 MED ORDER — HYDROCODONE-ACETAMINOPHEN 5-325 MG PO TABS
1.0000 | ORAL_TABLET | ORAL | Status: DC | PRN
  Filled 2022-03-11: qty 1

## 2022-03-11 MED ORDER — POTASSIUM CHLORIDE CRYS ER 20 MEQ PO TBCR
40.0000 meq | EXTENDED_RELEASE_TABLET | ORAL | Status: AC
  Administered 2022-03-11 (×2): 40 meq via ORAL
  Filled 2022-03-11 (×2): qty 2

## 2022-03-11 MED ORDER — ACETAMINOPHEN 650 MG RE SUPP: 650.0000 mg | Freq: Four times a day (QID) | RECTAL | Status: DC | PRN

## 2022-03-11 NOTE — Assessment & Plan Note (Addendum)
Continue Rocephin azithromycin Likely postviral pneumonia in the setting of recent RSV infection Evidence of cough productive of green sputum, fever chest imaging with bilateral infiltrates right worse than left and acute hypoxia all indicative of diagnosis of pneumonia

## 2022-03-11 NOTE — Assessment & Plan Note (Signed)
this patient has acute respiratory failure with Hypoxia as documented by the presence of following: O2 saturatio< 90% on RA    Likely due to:   Pneumonia,  RSV Provide O2 therapy and titrate as needed  Continuous pulse ox   check Pulse ox with ambulation prior to discharge   may need  TC consult for home O2 set up    flutter valve ordered

## 2022-03-11 NOTE — Assessment & Plan Note (Signed)
-  SIRS criteria met with     tachycardia   ,   fever   RR >20 Today's Vitals   03/10/22 2219 03/10/22 2219 03/10/22 2219 03/10/22 2339  BP:    125/79  Pulse: (!) 104   95  Resp:   20 20  Temp:  99.4 F (37.4 C)  99 F (37.2 C)  TempSrc:  Oral  Oral  SpO2: 92%   95%  Weight:      Height:      PainSc:    0-No pain      The recent clinical data is shown below. Vitals:   03/10/22 2219 03/10/22 2219 03/10/22 2219 03/10/22 2339  BP:    125/79  Pulse: (!) 104   95  Resp:   20 20  Temp:  99.4 F (37.4 C)  99 F (37.2 C)  TempSrc:  Oral  Oral  SpO2: 92%   95%  Weight:      Height:           -Most likely source being:  pulmonary, viral,   Patient meeting criteria for Severe sepsis with    evidence of end organ damage/organ dysfunction such as      elevated lactic acid >2     Component Value Date/Time   LATICACIDVEN 2.9 ([place]) 03/10/2022 2047   Acute hypoxia requiring new supplemental oxygen, SpO2: 95 % O2 Flow Rate (L/min): 2 L/min     - Obtain serial lactic acid and procalcitonin level.  - Initiated IV antibiotics in ER: Antibiotics Given (last 72 hours)     Date/Time Action Medication Dose Rate   03/10/22 1932 New Bag/Given   cefTRIAXone (ROCEPHIN) 2 g in sodium chloride 0.9 % 100 mL IVPB 2 g 200 mL/hr   03/10/22 2002 Given   doxycycline (VIBRA-TABS) tablet 100 mg 100 mg        Will continue  on : Rocephin and azithromycin   - await results of blood and urine culture  - Rehydrate aggressively  Intravenous fluids were administered,       12:02 AM

## 2022-03-11 NOTE — Assessment & Plan Note (Addendum)
Supportive measures treat underlying superimposed bacterial pneumonia Given reactive airway contribution will add albuterol as needed and DuoNeb Suspect postviral bacterial pneumonia will continue Rocephin azithromycin for now check procalcitonin

## 2022-03-11 NOTE — Progress Notes (Signed)
PROGRESS NOTE  Maurice [name] BHA:[id number] DOB: 1950/09/08   PCP: [name], MD  Patient is from: Home.  Independently ambulates at baseline.  DOA: 03/10/2022 LOS: 0  Chief complaints Chief Complaint  Patient presents with   Cough     Brief Narrative / Interim history: 72 year old M with PMH of stage IIb prostate cancer and HLD presenting with progressive cough, wheezing, fever and hypoxic respiratory failure.  Seen in ED 2 days prior and tested positive for RSV and he was discharged home.  Return to ED due to desaturation to 88% on RA, fever, productive cough with greenish phlegm, wheezing and shortness of breath.    In ED, febrile to 102.6, tachycardic to 110s and tachypneic to 20s saturating in low 90s on 2 L by nasal cannula.  Has no leukocytosis but elevated Pro-Cal to 3.2.  Lactic acid 2.9.  CXR with focal opacity along the inferior medial LLL.  CTA chest with bilateral lower lobe airspace disease, right> left suggesting pneumonia.  Cultures drawn.  Patient was started on IV fluid, IV ceftriaxone and azithromycin, and admitted.   Subjective: Seen and examined earlier this morning.  Reports improvement in his wheezing and breathing but not quite close to baseline.  Still with productive cough and greenish phlegm.  He said he was drenched in sweat overnight.  Denies history of asthma or COPD.  Objective: Vitals:   03/10/22 2219 03/10/22 2219 03/10/22 2339 03/11/22 0823  BP:   125/79 123/79  Pulse:   95 88  Resp:  '20 20 18  '$ Temp: 99.4 F (37.4 C)  99 F (37.2 C) 98.6 F (37 C)  TempSrc: Oral  Oral Oral  SpO2:   95% 100%  Weight:      Height:        Examination:  GENERAL: No apparent distress.  Nontoxic. HEENT: MMM.  Vision and hearing grossly intact.  NECK: Supple.  No apparent JVD.  RESP:  No IWOB.  Rhonchorous.  CVS:  RRR. Heart sounds normal.  ABD/GI/GU: BS+. Abd soft, NTND.  MSK/EXT:  Moves extremities. No apparent deformity. No edema.  SKIN: no apparent  skin lesion or wound NEURO: Awake, alert and oriented appropriately.  No apparent focal neuro deficit. PSYCH: Calm. Normal affect.   Procedures:  None  Microbiology summarized: Blood urine and sputum culture pending.  Assessment and plan: Principal Problem:   Severe sepsis ([place]) Active Problems:   Prostate cancer ([place])   RSV (respiratory syncytial virus pneumonia)   Acute respiratory failure with hypoxia ([facility])   CAP (community acquired pneumonia)   Severe sepsis due to bacterial pneumonia and RSV infection: POA.  Patient had tachycardia, tachypnea, fever and lactic acidosis.  Reportedly saturating at 88% on room air at home.  Has been on 2 L by nasal cannula here.  CTA chest concerning for bilateral lower lobe infiltrate compatible with pneumonia.  He also have diffuse bronchial wall thickening compatible with bronchitis.  Continues to feel out of breath and wheezy.  He is very rhonchorous on exam.  Procalcitonin elevated.  Lactic acidosis resolved. -Continue IV ceftriaxone and azithromycin. -On Solu-Medrol and nebulizers -Follow cultures. -Ambulatory saturation, incentive spirometry -Mucolytic's and antitussive   Hyponatremia, hypokalemia, hypomagnesemia and hypophosphatemia -Monitor replenish as appropriate  History of prostate cancer -Outpatient follow-up.  Body mass index is 27.31 kg/m.          DVT prophylaxis:  enoxaparin (LOVENOX) injection 40 mg Start: 03/11/22 1315 SCDs Start: 03/11/22 0022  Code Status: Full code Family Communication: None  at bedside Level of care: Med-Surg Status is: Observation The patient will require care spanning > 2 midnights and should be moved to inpatient because: Severe sepsis in the setting of bilateral pneumonia and electrolyte derangements   Final disposition: Likely home once medically stable Consultants:  None  Sch Meds:  Scheduled Meds:  enoxaparin (LOVENOX) injection  40 mg Subcutaneous Q24H   guaiFENesin  600 mg  Oral BID   HYDROcodone bit-homatropine  5 mL Oral Once   methylPREDNISolone (SOLU-MEDROL) injection  40 mg Intravenous Q12H   potassium chloride  40 mEq Oral Q4H   Continuous Infusions:  azithromycin 500 mg (03/11/22 0935)   cefTRIAXone (ROCEPHIN)  IV     magnesium sulfate bolus IVPB     PRN Meds:.acetaminophen **OR** acetaminophen, ipratropium-albuterol  Antimicrobials: Anti-infectives (From admission, onward)    Start     Dose/Rate Route Frequency Ordered Stop   03/11/22 1600  cefTRIAXone (ROCEPHIN) 2 g in sodium chloride 0.9 % 100 mL IVPB        2 g 200 mL/hr over 30 Minutes Intravenous Every 24 hours 03/11/22 0000 03/16/22 1559   03/11/22 1000  azithromycin (ZITHROMAX) 500 mg in sodium chloride 0.9 % 250 mL IVPB        500 mg 250 mL/hr over 60 Minutes Intravenous Every 24 hours 03/11/22 0000 03/16/22 0959   03/10/22 1930  cefTRIAXone (ROCEPHIN) 2 g in sodium chloride 0.9 % 100 mL IVPB        2 g 200 mL/hr over 30 Minutes Intravenous  Once 03/10/22 1921 03/10/22 2004   03/10/22 1930  doxycycline (VIBRA-TABS) tablet 100 mg        100 mg Oral  Once 03/10/22 1921 03/10/22 2002        I have personally reviewed the following labs and images: CBC: Recent Labs  Lab 03/08/22 2320 03/10/22 1205 03/11/22 0202  WBC 11.3* 8.0 5.4  NEUTROABS 9.3* 6.0  --   HGB 15.2 13.9 12.9*  HCT 45.2 40.1 37.8*  MCV 95.2 92.6 93.6  PLT 189 203 187   BMP &GFR Recent Labs  Lab 03/08/22 2320 03/10/22 1205 03/11/22 0202  NA 134* 136 133*  K 3.8 3.5 3.2*  CL 105 104 102  CO2 21* 22 20*  GLUCOSE 149* 101* 121*  BUN '21 12 13  '$ CREATININE 1.14 0.80 0.98  CALCIUM 8.5* 9.1 8.0*  MG  --   --  1.6*  PHOS  --   --  2.0*   Estimated Creatinine Clearance: 69.1 mL/min (by C-G formula based on SCr of 0.98 mg/dL). Liver & Pancreas: Recent Labs  Lab 03/08/22 2320 03/11/22 0202  AST 44* 31  ALT 52* 35  ALKPHOS 54 47  BILITOT 0.5 0.9  PROT 6.8 5.9*  ALBUMIN 3.8 2.6*   No results for  input(s): "LIPASE", "AMYLASE" in the last 168 hours. No results for input(s): "AMMONIA" in the last 168 hours. Diabetic: No results for input(s): "HGBA1C" in the last 72 hours. No results for input(s): "GLUCAP" in the last 168 hours. Cardiac Enzymes: Recent Labs  Lab 03/11/22 0202  CKTOTAL 129   No results for input(s): "PROBNP" in the last 8760 hours. Coagulation Profile: Recent Labs  Lab 03/08/22 2320  INR 1.1   Thyroid Function Tests: Recent Labs    03/11/22 0202  TSH 0.741   Lipid Profile: No results for input(s): "CHOL", "HDL", "LDLCALC", "TRIG", "CHOLHDL", "LDLDIRECT" in the last 72 hours. Anemia Panel: No results for input(s): "VITAMINB12", "FOLATE", "FERRITIN", "TIBC", "IRON", "  RETICCTPCT" in the last 72 hours. Urine analysis:    Component Value Date/Time   COLORURINE YELLOW 03/11/2022 0123   APPEARANCEUR HAZY (A) 03/11/2022 0123   LABSPEC 1.015 03/11/2022 0123   PHURINE 5.0 03/11/2022 0123   GLUCOSEU NEGATIVE 03/11/2022 0123   HGBUR NEGATIVE 03/11/2022 0123   BILIRUBINUR NEGATIVE 03/11/2022 0123   KETONESUR 5 (A) 03/11/2022 0123   PROTEINUR 30 (A) 03/11/2022 0123   NITRITE NEGATIVE 03/11/2022 0123   LEUKOCYTESUR NEGATIVE 03/11/2022 0123   Sepsis Labs: Invalid input(s): "PROCALCITONIN", "LACTICIDVEN"  Microbiology: Recent Results (from the past 240 hour(s))  Resp panel by RT-PCR (RSV, Flu A&B, Covid) Anterior Nasal Swab     Status: Abnormal   Collection Time: 03/08/22 10:46 PM   Specimen: Anterior Nasal Swab  Result Value Ref Range Status   SARS Coronavirus 2 by RT PCR NEGATIVE NEGATIVE Final    Comment: (NOTE) SARS-CoV-2 target nucleic acids are NOT DETECTED.  The SARS-CoV-2 RNA is generally detectable in upper respiratory specimens during the acute phase of infection. The lowest concentration of SARS-CoV-2 viral copies this assay can detect is 138 copies/mL. A negative result does not preclude SARS-Cov-2 infection and should not be used as the  sole basis for treatment or other patient management decisions. A negative result may occur with  improper specimen collection/handling, submission of specimen other than nasopharyngeal swab, presence of viral mutation(s) within the areas targeted by this assay, and inadequate number of viral copies(<138 copies/mL). A negative result must be combined with clinical observations, patient history, and epidemiological information. The expected result is Negative.  Fact Sheet for Patients:  EntrepreneurPulse.com.au  Fact Sheet for Healthcare Providers:  IncredibleEmployment.be  This test is no t yet approved or cleared by the [place] FDA and  has been authorized for detection and/or diagnosis of SARS-CoV-2 by FDA under an Emergency Use Authorization (EUA). This EUA will remain  in effect (meaning this test can be used) for the duration of the COVID-19 declaration under Section 564(b)(1) of the Act, 21 U.S.C.section 360bbb-3(b)(1), unless the authorization is terminated  or revoked sooner.       Influenza A by PCR NEGATIVE NEGATIVE Final   Influenza B by PCR NEGATIVE NEGATIVE Final    Comment: (NOTE) The Xpert Xpress SARS-CoV-2/FLU/RSV plus assay is intended as an aid in the diagnosis of influenza from Nasopharyngeal swab specimens and should not be used as a sole basis for treatment. Nasal washings and aspirates are unacceptable for Xpert Xpress SARS-CoV-2/FLU/RSV testing.  Fact Sheet for Patients: EntrepreneurPulse.com.au  Fact Sheet for Healthcare Providers: IncredibleEmployment.be  This test is not yet approved or cleared by the [place] FDA and has been authorized for detection and/or diagnosis of SARS-CoV-2 by FDA under an Emergency Use Authorization (EUA). This EUA will remain in effect (meaning this test can be used) for the duration of the COVID-19 declaration under Section 564(b)(1) of the  Act, 21 U.S.C. section 360bbb-3(b)(1), unless the authorization is terminated or revoked.     Resp Syncytial Virus by PCR POSITIVE (A) NEGATIVE Final    Comment: (NOTE) Fact Sheet for Patients: EntrepreneurPulse.com.au  Fact Sheet for Healthcare Providers: IncredibleEmployment.be  This test is not yet approved or cleared by the [place] FDA and has been authorized for detection and/or diagnosis of SARS-CoV-2 by FDA under an Emergency Use Authorization (EUA). This EUA will remain in effect (meaning this test can be used) for the duration of the COVID-19 declaration under Section 564(b)(1) of the Act, 21 U.S.C. section 360bbb-3(b)(1), unless  the authorization is terminated or revoked.  Performed at [facility], [place] [address] [place], [place] [address]   Blood Culture (routine x 2)     Status: None (Preliminary result)   Collection Time: 03/08/22 11:20 PM   Specimen: BLOOD  Result Value Ref Range Status   Specimen Description   Final    BLOOD BLOOD LEFT HAND Performed at [facility] [address] [place], [place] [address]    Special Requests   Final    BOTTLES DRAWN AEROBIC AND ANAEROBIC Blood Culture adequate volume Performed at [facility] [address] [place], [place] [address]    Culture   Final    NO GROWTH 2 DAYS Performed at [facility] [address], [place], [place] [address]    Report Status PENDING  Incomplete  Blood Culture (routine x 2)     Status: None (Preliminary result)   Collection Time: 03/08/22 11:20 PM   Specimen: BLOOD  Result Value Ref Range Status   Specimen Description   Final    BLOOD LEFT ANTECUBITAL Performed at [facility] [address], [place], [place] [address]    Special Requests   Final    BOTTLES DRAWN AEROBIC AND ANAEROBIC Blood Culture adequate volume Performed at [facility] [address] [place], [place] [address]    Culture   Final    NO GROWTH 2 DAYS Performed at [facility] [address] [place], [place] [address]    Report Status PENDING  Incomplete  Culture, blood (routine x 2)     Status: None (Preliminary result)   Collection Time: 03/10/22  8:47 PM   Specimen: BLOOD LEFT HAND  Result Value Ref Range Status   Specimen Description   Final    BLOOD LEFT HAND Performed at [facility], [place] [address] [place], [place] [address]    Special Requests   Final    BOTTLES DRAWN AEROBIC AND ANAEROBIC Blood Culture adequate volume Performed at [facility], [address], [place], [place] [address]    Culture PENDING  Incomplete   Report Status PENDING  Incomplete    Radiology Studies: CT Chest Wo Contrast  Result Date: 03/10/2022 CLINICAL DATA:  Respiratory illness.  Cough for 6 days. EXAM: CT CHEST WITHOUT CONTRAST TECHNIQUE: Multidetector CT imaging of the chest was performed following the standard protocol without IV contrast. RADIATION DOSE REDUCTION: This exam was performed according to the departmental dose-optimization program which includes automated exposure control, adjustment of the mA and/or kV according to patient size and/or use of iterative reconstruction technique. COMPARISON:  Chest radiograph 03/10/2022 FINDINGS: Cardiovascular: Normal heart size. Lad coronary artery calcifications. Trace anterior pericardial fluid. Mediastinum/Nodes: No enlarged mediastinal or axillary lymph nodes. Thyroid gland, trachea, and esophagus demonstrate no significant findings. Lungs/Pleura: No pleural fluid. There is diffuse bronchial wall thickening noted. Central airways appear patent. There is diffuse bronchial wall thickening. Bilateral lower lobe airspace disease is noted, right greater than left. Subsegmental atelectasis noted in the right middle lobe. No suspicious pulmonary nodule or mass identified at this  time. Upper Abdomen: No acute abnormality. Musculoskeletal: No chest wall mass or suspicious bone lesions identified. Multilevel thoracic degenerative disc disease. IMPRESSION: 1. Bilateral lower lobe airspace disease, right greater than left, compatible with pneumonia. Follow-up imaging advised to ensure complete resolution and to rule out underlying malignancy. 2. Diffuse bronchial wall thickening compatible with bronchitis. 3. Right  middle lobe subsegmental atelectasis. 4. Coronary artery calcifications. Electronically Signed   By: Kerby Moors M.D.   On: 03/10/2022 18:10      Aanchal Cope T. [place]  If 7PM-7AM, please contact night-coverage [email or web address] 03/11/2022, 12:24 PM

## 2022-03-12 DIAGNOSIS — J9601 Acute respiratory failure with hypoxia: Secondary | ICD-10-CM | POA: Diagnosis not present

## 2022-03-12 DIAGNOSIS — A419 Sepsis, unspecified organism: Secondary | ICD-10-CM | POA: Diagnosis not present

## 2022-03-12 DIAGNOSIS — R0902 Hypoxemia: Secondary | ICD-10-CM | POA: Diagnosis not present

## 2022-03-12 DIAGNOSIS — J189 Pneumonia, unspecified organism: Secondary | ICD-10-CM | POA: Diagnosis not present

## 2022-03-12 LAB — RENAL FUNCTION PANEL
Albumin: 2.4 g/dL — ABNORMAL LOW (ref 3.5–5.0)
Anion gap: 8 (ref 5–15)
BUN: 17 mg/dL (ref 8–23)
CO2: 21 mmol/L — ABNORMAL LOW (ref 22–32)
Calcium: 8.4 mg/dL — ABNORMAL LOW (ref 8.9–10.3)
Chloride: 105 mmol/L (ref 98–111)
Creatinine, Ser: 0.89 mg/dL (ref 0.61–1.24)
GFR, Estimated: >60 mL/min (ref >60)
Glucose, Bld: 136 mg/dL — ABNORMAL HIGH (ref 70–99)
Phosphorus: 2.7 mg/dL (ref 2.5–4.6)
Potassium: 4.3 mmol/L (ref 3.5–5.1)
Sodium: 134 mmol/L — ABNORMAL LOW (ref 135–145)

## 2022-03-12 LAB — CBC
HCT: 37.5 % — ABNORMAL LOW (ref 39.0–52.0)
Hemoglobin: 12.9 g/dL — ABNORMAL LOW (ref 13.0–17.0)
MCH: 31.5 pg (ref 26.0–34.0)
MCHC: 34.4 g/dL (ref 30.0–36.0)
MCV: 91.7 fL (ref 80.0–100.0)
Platelets: 288 10*3/uL (ref 150–400)
RBC: 4.09 MIL/uL — ABNORMAL LOW (ref 4.22–5.81)
RDW: 12.6 % (ref 11.5–15.5)
WBC: 12.7 10*3/uL — ABNORMAL HIGH (ref 4.0–10.5)
nRBC: 0 % (ref 0.0–0.2)

## 2022-03-12 LAB — URINE CULTURE: Culture: NO GROWTH

## 2022-03-12 LAB — MAGNESIUM: Magnesium: 2.4 mg/dL (ref 1.7–2.4)

## 2022-03-12 MED ORDER — GUAIFENESIN ER 600 MG PO TB12: 600.0000 mg | ORAL_TABLET | Freq: Two times a day (BID) | ORAL | 0 refills | Status: DC

## 2022-03-12 MED ORDER — AMOXICILLIN-POT CLAVULANATE 875-125 MG PO TABS: 1.0000 | ORAL_TABLET | Freq: Two times a day (BID) | ORAL | 0 refills | Status: DC

## 2022-03-12 MED ORDER — AZITHROMYCIN 250 MG PO TABS: 250.0000 mg | ORAL_TABLET | Freq: Every day | ORAL | 0 refills | Status: DC

## 2022-03-12 MED ORDER — GUAIFENESIN-DM 100-10 MG/5ML PO SYRP: 5.0000 mL | ORAL_SOLUTION | ORAL | Status: DC | PRN

## 2022-03-12 NOTE — Discharge Summary (Signed)
Physician Discharge Summary  Maurice [name] JJO:[id number] DOB: Jan 27, 1951 DOA: 03/10/2022  PCP: [name], MD  Admit date: 03/10/2022 Discharge date: 03/12/2022 Admitted From: Home Disposition: Home Recommendations for Outpatient Follow-up:  Follow up with PCP in 1 week Check BMP and CBC in 1 week Please follow up on the following pending results: None  Home Health: Not indicated Equipment/Devices: Not indicated  Discharge Condition: Stable CODE STATUS: Full code  Follow-up Information     [name], MD. Schedule an appointment as soon as possible for a visit in 1 week(s).   Specialty: Internal Medicine Contact information: [place] [address] [facility] course 72 year old M with PMH of stage IIb prostate cancer and HLD presenting with progressive cough, wheezing, fever and hypoxic respiratory failure.  Seen in ED 2 days prior and tested positive for RSV and he was discharged home.  Return to ED due to desaturation to 88% on RA, fever, productive cough with greenish phlegm, wheezing and shortness of breath.     In ED, febrile to 102.6, tachycardic to 110s and tachypneic to 20s saturating in low 90s on 2 L by nasal cannula.  Has no leukocytosis but elevated Pro-Cal to 3.2.  Lactic acid 2.9.  CXR with focal opacity along the inferior medial LLL.  CTA chest with bilateral lower lobe airspace disease, right> left suggesting pneumonia.  Cultures drawn.  Patient was started on IV fluid, IV ceftriaxone and azithromycin, and admitted.  The next day, added Solu-Medrol and nebulizers.  On the day of discharge, blood cultures negative.  Patient's respiratory symptoms improved.  Remained on room air.  He is discharged on p.o. Augmentin and azithromycin for 3 more days to complete a total of 5 days course.  See individual problem list below for more.   Problems addressed during this hospitalization Principal Problem:   Severe  sepsis ([place]) Active Problems:   Prostate cancer ([place])   RSV (respiratory syncytial virus pneumonia)   Acute respiratory failure with hypoxia ([facility])   Pneumonia of both lower lobes due to infectious organism   Wheeze   Hypoxia Hypokalemia and hypomagnesemia resolved. Hyponatremia improved. Lactic acidosis resolved.   Vital signs Vitals:   03/11/22 1731 03/11/22 2047 03/12/22 0453 03/12/22 0807  BP:  133/84 (!) 152/92 (!) 147/84  Pulse:  86 87 76  Temp: 98.3 F (36.8 C) 98.1 F (36.7 C) 99.4 F (37.4 C) 97.9 F (36.6 C)  Resp:  '16 16 16  '$ Height:      Weight:      SpO2:  94% 97% 93%  TempSrc:  Oral Oral Oral  BMI (Calculated):         Discharge exam  GENERAL: No apparent distress.  Nontoxic. HEENT: MMM.  Vision and hearing grossly intact.  NECK: Supple.  No apparent JVD.  RESP:  No IWOB.  Fair aeration bilaterally. CVS:  RRR. Heart sounds normal.  ABD/GI/GU: BS+. Abd soft, NTND.  MSK/EXT:  Moves extremities. No apparent deformity. No edema.  SKIN: no apparent skin lesion or wound NEURO: Awake and alert. Oriented appropriately.  No apparent focal neuro deficit. PSYCH: Calm. Normal affect.   Discharge Instructions Discharge Instructions     Call MD for:  difficulty breathing, headache or visual disturbances   Complete by: As directed    Call MD for:  extreme fatigue   Complete by: As directed    Call MD for:  severe uncontrolled pain   Complete by: As directed    Diet general   Complete by: As directed    Discharge instructions   Complete by: As directed    It has been a pleasure taking care of you!  You were hospitalized due to pneumonia and RSV infection.  Been started on antibiotics for pneumonia.  Discharging you on more antibiotics to complete treatment course.  Please take the medications as prescribed.  Follow-up with your primary care doctor in 1 to 2 weeks or sooner if needed.   Take care,   Increase activity slowly   Complete by: As directed        Allergies as of 03/12/2022   No Known Allergies      Medication List     TAKE these medications    amoxicillin-clavulanate 875-125 MG tablet Commonly known as: AUGMENTIN Take 1 tablet by mouth 2 (two) times daily for 3 days.   azithromycin 250 MG tablet Commonly known as: Zithromax Take 1 tablet (250 mg total) by mouth daily for 3 days.   guaiFENesin 600 MG 12 hr tablet Commonly known as: MUCINEX Take 1 tablet (600 mg total) by mouth 2 (two) times daily for 5 days.   ibuprofen 200 MG tablet Commonly known as: ADVIL Take 400 mg by mouth daily as needed for mild pain.   oxymetazoline 0.05 % nasal spray Commonly known as: AFRIN Place 1-5 sprays into both nostrils at bedtime as needed for congestion.   PRESCRIPTION MEDICATION Place 1 drop into the right eye in the morning, at noon, and at bedtime. Unknown cataract eye drop   sildenafil 100 MG tablet Commonly known as: VIAGRA Take 100 mg by mouth daily as needed for erectile dysfunction.   TUMS PO Take 3 tablets by mouth as needed (reflux).        Consultations: None  Procedures/Studies:   CT Chest Wo Contrast  Result Date: 03/10/2022 CLINICAL DATA:  Respiratory illness.  Cough for 6 days. EXAM: CT CHEST WITHOUT CONTRAST TECHNIQUE: Multidetector CT imaging of the chest was performed following the standard protocol without IV contrast. RADIATION DOSE REDUCTION: This exam was performed according to the departmental dose-optimization program which includes automated exposure control, adjustment of the mA and/or kV according to patient size and/or use of iterative reconstruction technique. COMPARISON:  Chest radiograph 03/10/2022 FINDINGS: Cardiovascular: Normal heart size. Lad coronary artery calcifications. Trace anterior pericardial fluid. Mediastinum/Nodes: No enlarged mediastinal or axillary lymph nodes. Thyroid gland, trachea, and esophagus demonstrate no significant findings. Lungs/Pleura: No pleural fluid. There is  diffuse bronchial wall thickening noted. Central airways appear patent. There is diffuse bronchial wall thickening. Bilateral lower lobe airspace disease is noted, right greater than left. Subsegmental atelectasis noted in the right middle lobe. No suspicious pulmonary nodule or mass identified at this time. Upper Abdomen: No acute abnormality. Musculoskeletal: No chest wall mass or suspicious bone lesions identified. Multilevel thoracic degenerative disc disease. IMPRESSION: 1. Bilateral lower lobe airspace disease, right greater than left, compatible with pneumonia. Follow-up imaging advised to ensure complete resolution and to rule out underlying malignancy. 2. Diffuse bronchial wall thickening compatible with bronchitis. 3. Right middle lobe subsegmental atelectasis. 4. Coronary artery calcifications. Electronically Signed   By: Kerby Moors M.D.   On: 03/10/2022 18:10   DG Chest 2 View  Result Date: 03/10/2022 CLINICAL DATA:  Worsening cough.  RSV 8 days ago. EXAM: CHEST - 2 VIEW COMPARISON:  None Available. FINDINGS: No pneumothorax or effusion. Normal cardiopericardial silhouette without view.  Focal consolidation along the left lower lobe posteromedial. Possible infiltrate. Recommend follow-up to confirm clearance. Degenerative changes along the spine IMPRESSION: Focal opacity along the inferior medial left lower lobe. Possible infiltrate. Recommend follow-up Electronically Signed   By: Jill Side M.D.   On: 03/10/2022 12:29   DG Chest Port 1 View  Result Date: 03/08/2022 CLINICAL DATA:  Possible sepsis.  Fever for 3 days. EXAM: PORTABLE CHEST 1 VIEW COMPARISON:  04/02/2021. FINDINGS: The heart size and mediastinal contours are within normal limits. No consolidation, effusion, or pneumothorax. No acute osseous abnormality. IMPRESSION: No active disease. Electronically Signed   By: Brett Fairy M.D.   On: 03/08/2022 23:11       The results of significant diagnostics from this hospitalization  (including imaging, microbiology, ancillary and laboratory) are listed below for reference.     Microbiology: Recent Results (from the past 240 hour(s))  Resp panel by RT-PCR (RSV, Flu A&B, Covid) Anterior Nasal Swab     Status: Abnormal   Collection Time: 03/08/22 10:46 PM   Specimen: Anterior Nasal Swab  Result Value Ref Range Status   SARS Coronavirus 2 by RT PCR NEGATIVE NEGATIVE Final    Comment: (NOTE) SARS-CoV-2 target nucleic acids are NOT DETECTED.  The SARS-CoV-2 RNA is generally detectable in upper respiratory specimens during the acute phase of infection. The lowest concentration of SARS-CoV-2 viral copies this assay can detect is 138 copies/mL. A negative result does not preclude SARS-Cov-2 infection and should not be used as the sole basis for treatment or other patient management decisions. A negative result may occur with  improper specimen collection/handling, submission of specimen other than nasopharyngeal swab, presence of viral mutation(s) within the areas targeted by this assay, and inadequate number of viral copies(<138 copies/mL). A negative result must be combined with clinical observations, patient history, and epidemiological information. The expected result is Negative.  Fact Sheet for Patients:  EntrepreneurPulse.com.au  Fact Sheet for Healthcare Providers:  IncredibleEmployment.be  This test is no t yet approved or cleared by the [place] FDA and  has been authorized for detection and/or diagnosis of SARS-CoV-2 by FDA under an Emergency Use Authorization (EUA). This EUA will remain  in effect (meaning this test can be used) for the duration of the COVID-19 declaration under Section 564(b)(1) of the Act, 21 U.S.C.section 360bbb-3(b)(1), unless the authorization is terminated  or revoked sooner.       Influenza A by PCR NEGATIVE NEGATIVE Final   Influenza B by PCR NEGATIVE NEGATIVE Final    Comment:  (NOTE) The Xpert Xpress SARS-CoV-2/FLU/RSV plus assay is intended as an aid in the diagnosis of influenza from Nasopharyngeal swab specimens and should not be used as a sole basis for treatment. Nasal washings and aspirates are unacceptable for Xpert Xpress SARS-CoV-2/FLU/RSV testing.  Fact Sheet for Patients: EntrepreneurPulse.com.au  Fact Sheet for Healthcare Providers: IncredibleEmployment.be  This test is not yet approved or cleared by the [place] FDA and has been authorized for detection and/or diagnosis of SARS-CoV-2 by FDA under an Emergency Use Authorization (EUA). This EUA will remain in effect (meaning this test can be used) for the duration of the COVID-19 declaration under Section 564(b)(1) of the Act, 21 U.S.C. section 360bbb-3(b)(1), unless the authorization is terminated or revoked.     Resp Syncytial Virus by PCR POSITIVE (A) NEGATIVE Final    Comment: (NOTE) Fact Sheet for Patients: EntrepreneurPulse.com.au  Fact Sheet for Healthcare Providers: IncredibleEmployment.be  This test is not yet approved or cleared by  the [employer] and has been authorized for detection and/or diagnosis of SARS-CoV-2 by FDA under an Emergency Use Authorization (EUA). This EUA will remain in effect (meaning this test can be used) for the duration of the COVID-19 declaration under Section 564(b)(1) of the Act, 21 U.S.C. section 360bbb-3(b)(1), unless the authorization is terminated or revoked.  Performed at [facility], [place] [address], [place], [place] [address]   Blood Culture (routine x 2)     Status: None (Preliminary result)   Collection Time: 03/08/22 11:20 PM   Specimen: BLOOD  Result Value Ref Range Status   Specimen Description   Final    BLOOD BLOOD LEFT HAND Performed at [facility] [address], [place], [place] [address]    Special Requests    Final    BOTTLES DRAWN AEROBIC AND ANAEROBIC Blood Culture adequate volume Performed at [facility] [address] [place], [place] [address]    Culture   Final    NO GROWTH 3 DAYS Performed at [facility], [place] [address], [place], [place] [address]    Report Status PENDING  Incomplete  Blood Culture (routine x 2)     Status: None (Preliminary result)   Collection Time: 03/08/22 11:20 PM   Specimen: BLOOD  Result Value Ref Range Status   Specimen Description   Final    BLOOD LEFT ANTECUBITAL Performed at [facility] [address], [place], [place] [address]    Special Requests   Final    BOTTLES DRAWN AEROBIC AND ANAEROBIC Blood Culture adequate volume Performed at [facility] [address], [place], [place] [address]    Culture   Final    NO GROWTH 3 DAYS Performed at [facility], [place] [address], [place], [place] [address]    Report Status PENDING  Incomplete  Culture, blood (routine x 2)     Status: None (Preliminary result)   Collection Time: 03/10/22  5:10 PM   Specimen: BLOOD  Result Value Ref Range Status   Specimen Description   Final    BLOOD RIGHT ANTECUBITAL Performed at [facility], [address], [place], [place] [address]    Special Requests   Final    BOTTLES DRAWN AEROBIC AND ANAEROBIC Blood Culture adequate volume Performed at [facility], [address], [place], [place] [address]    Culture   Final    NO GROWTH 1 DAY Performed at [facility], [place] [address] [place], [place] [address]    Report Status PENDING  Incomplete  Culture, blood (routine x 2)     Status: None (Preliminary result)   Collection Time: 03/10/22  8:47 PM   Specimen: BLOOD LEFT HAND  Result Value Ref Range Status   Specimen Description   Final    BLOOD LEFT HAND Performed at [facility], [place] [address] [place], [place] [address]    Special  Requests   Final    BOTTLES DRAWN AEROBIC AND ANAEROBIC Blood Culture adequate volume Performed at [facility], [address] [place], [place] [address]    Culture   Final    NO GROWTH 1 DAY Performed at [facility], [place] [address], [place],  [address]    Report Status PENDING  Incomplete  Expectorated Sputum Assessment w Gram Stain, Rflx to Resp Cult     Status: None   Collection Time: 03/11/22 12:00  AM   Specimen: Sputum  Result Value Ref Range Status   Specimen Description SPUTUM  Final   Special Requests NONE  Final   Sputum evaluation   Final    Sputum specimen not acceptable for testing.  Please recollect.   NOTIFIED CARISSA PAGE  ON 03/11/22 @ 1651 BY DRT Performed at [facility], [place] [address], [place], [place] [address]    Report Status 03/11/2022 FINAL  Final  Urine Culture     Status: None   Collection Time: 03/11/22  1:23 AM   Specimen: Urine, Catheterized  Result Value Ref Range Status   Specimen Description URINE, CATHETERIZED  Final   Special Requests NONE  Final   Culture   Final    NO GROWTH Performed at [facility], [place] [address], [place],  [address]    Report Status 03/12/2022 FINAL  Final     Labs:  CBC: Recent Labs  Lab 03/08/22 2320 03/10/22 1205 03/11/22 0202 03/12/22 0405  WBC 11.3* 8.0 5.4 12.7*  NEUTROABS 9.3* 6.0  --   --   HGB 15.2 13.9 12.9* 12.9*  HCT 45.2 40.1 37.8* 37.5*  MCV 95.2 92.6 93.6 91.7  PLT 189 203 187 288   BMP &GFR Recent Labs  Lab 03/08/22 2320 03/10/22 1205 03/11/22 0202 03/12/22 0405  NA 134* 136 133* 134*  K 3.8 3.5 3.2* 4.3  CL 105 104 102 105  CO2 21* 22 20* 21*  GLUCOSE 149* 101* 121* 136*  BUN '21 12 13 17  '$ CREATININE 1.14 0.80 0.98 0.89  CALCIUM 8.5* 9.1 8.0* 8.4*  MG  --   --  1.6* 2.4  PHOS  --   --  2.0* 2.7   Estimated Creatinine Clearance: 76.1 mL/min (by C-G formula based on SCr of 0.89 mg/dL). Liver & Pancreas: Recent Labs  Lab  03/08/22 2320 03/11/22 0202 03/12/22 0405  AST 44* 31  --   ALT 52* 35  --   ALKPHOS 54 47  --   BILITOT 0.5 0.9  --   PROT 6.8 5.9*  --   ALBUMIN 3.8 2.6* 2.4*   No results for input(s): "LIPASE", "AMYLASE" in the last 168 hours. No results for input(s): "AMMONIA" in the last 168 hours. Diabetic: No results for input(s): "HGBA1C" in the last 72 hours. No results for input(s): "GLUCAP" in the last 168 hours. Cardiac Enzymes: Recent Labs  Lab 03/11/22 0202  CKTOTAL 129   No results for input(s): "PROBNP" in the last 8760 hours. Coagulation Profile: Recent Labs  Lab 03/08/22 2320  INR 1.1   Thyroid Function Tests: Recent Labs    03/11/22 0202  TSH 0.741   Lipid Profile: No results for input(s): "CHOL", "HDL", "LDLCALC", "TRIG", "CHOLHDL", "LDLDIRECT" in the last 72 hours. Anemia Panel: No results for input(s): "VITAMINB12", "FOLATE", "FERRITIN", "TIBC", "IRON", "RETICCTPCT" in the last 72 hours. Urine analysis:    Component Value Date/Time   COLORURINE YELLOW 03/11/2022 0123   APPEARANCEUR HAZY (A) 03/11/2022 0123   LABSPEC 1.015 03/11/2022 0123   PHURINE 5.0 03/11/2022 0123   GLUCOSEU NEGATIVE 03/11/2022 0123   HGBUR NEGATIVE 03/11/2022 0123   BILIRUBINUR NEGATIVE 03/11/2022 0123   KETONESUR 5 (A) 03/11/2022 0123   PROTEINUR 30 (A) 03/11/2022 0123   NITRITE NEGATIVE 03/11/2022 0123   LEUKOCYTESUR NEGATIVE 03/11/2022 0123   Sepsis Labs: Invalid input(s): "PROCALCITONIN", "LACTICIDVEN"   SIGNED:  Mercy Riding, MD  [facility] 03/12/2022, 5:54 PM

## 2022-03-14 LAB — CULTURE, BLOOD (ROUTINE X 2)
Culture: NO GROWTH
Culture: NO GROWTH
Special Requests: ADEQUATE
Special Requests: ADEQUATE

## 2022-03-15 ENCOUNTER — Encounter (HOSPITAL_COMMUNITY): Payer: Self-pay

## 2022-03-15 ENCOUNTER — Emergency Department (HOSPITAL_BASED_OUTPATIENT_CLINIC_OR_DEPARTMENT_OTHER): Payer: Medicare Other

## 2022-03-15 ENCOUNTER — Other Ambulatory Visit: Payer: Self-pay

## 2022-03-15 ENCOUNTER — Encounter (HOSPITAL_BASED_OUTPATIENT_CLINIC_OR_DEPARTMENT_OTHER): Payer: Self-pay | Admitting: Emergency Medicine

## 2022-03-15 ENCOUNTER — Inpatient Hospital Stay (HOSPITAL_BASED_OUTPATIENT_CLINIC_OR_DEPARTMENT_OTHER)
Admission: EM | Admit: 2022-03-15 | Discharge: 2022-03-20 | DRG: 195 | Disposition: A | Discharge status: Home / Self Care | Payer: Medicare Other | Attending: Family Medicine | Admitting: Family Medicine

## 2022-03-15 DIAGNOSIS — Z8261 Family history of arthritis: Secondary | ICD-10-CM | POA: Diagnosis not present

## 2022-03-15 DIAGNOSIS — R7401 Elevation of levels of liver transaminase levels: Secondary | ICD-10-CM | POA: Diagnosis not present

## 2022-03-15 DIAGNOSIS — J189 Pneumonia, unspecified organism: Secondary | ICD-10-CM | POA: Diagnosis not present

## 2022-03-15 DIAGNOSIS — J121 Respiratory syncytial virus pneumonia: Principal | ICD-10-CM | POA: Diagnosis present

## 2022-03-15 DIAGNOSIS — C61 Malignant neoplasm of prostate: Secondary | ICD-10-CM | POA: Diagnosis present

## 2022-03-15 DIAGNOSIS — R0902 Hypoxemia: Secondary | ICD-10-CM | POA: Diagnosis present

## 2022-03-15 DIAGNOSIS — R509 Fever, unspecified: Secondary | ICD-10-CM | POA: Diagnosis not present

## 2022-03-15 DIAGNOSIS — J309 Allergic rhinitis, unspecified: Secondary | ICD-10-CM | POA: Diagnosis not present

## 2022-03-15 DIAGNOSIS — M1990 Unspecified osteoarthritis, unspecified site: Secondary | ICD-10-CM | POA: Diagnosis not present

## 2022-03-15 DIAGNOSIS — B001 Herpesviral vesicular dermatitis: Secondary | ICD-10-CM | POA: Diagnosis not present

## 2022-03-15 DIAGNOSIS — Z1152 Encounter for screening for COVID-19: Secondary | ICD-10-CM | POA: Diagnosis not present

## 2022-03-15 DIAGNOSIS — Z79899 Other long term (current) drug therapy: Secondary | ICD-10-CM

## 2022-03-15 DIAGNOSIS — J340 Abscess, furuncle and carbuncle of nose: Secondary | ICD-10-CM | POA: Insufficient documentation

## 2022-03-15 LAB — RESP PANEL BY RT-PCR (RSV, FLU A&B, COVID)  RVPGX2
Influenza A by PCR: NEGATIVE
Influenza B by PCR: NEGATIVE
Resp Syncytial Virus by PCR: NEGATIVE
SARS Coronavirus 2 by RT PCR: NEGATIVE

## 2022-03-15 LAB — LEGIONELLA PNEUMOPHILA SEROGP 1 UR AG: L. pneumophila Serogp 1 Ur Ag: NEGATIVE

## 2022-03-15 LAB — COMPREHENSIVE METABOLIC PANEL
ALT: 111 U/L — ABNORMAL HIGH (ref 0–44)
AST: 53 U/L — ABNORMAL HIGH (ref 15–41)
Albumin: 3.5 g/dL (ref 3.5–5.0)
Alkaline Phosphatase: 65 U/L (ref 38–126)
Anion gap: 10 (ref 5–15)
BUN: 15 mg/dL (ref 8–23)
CO2: 23 mmol/L (ref 22–32)
Calcium: 8.7 mg/dL — ABNORMAL LOW (ref 8.9–10.3)
Chloride: 103 mmol/L (ref 98–111)
Creatinine, Ser: 0.88 mg/dL (ref 0.61–1.24)
GFR, Estimated: >60 mL/min (ref >60)
Glucose, Bld: 113 mg/dL — ABNORMAL HIGH (ref 70–99)
Potassium: 3.9 mmol/L (ref 3.5–5.1)
Sodium: 136 mmol/L (ref 135–145)
Total Bilirubin: 0.5 mg/dL (ref 0.3–1.2)
Total Protein: 6.8 g/dL (ref 6.5–8.1)

## 2022-03-15 LAB — CBC WITH DIFFERENTIAL/PLATELET
Abs Immature Granulocytes: 1.5 10*3/uL — ABNORMAL HIGH (ref 0.00–0.07)
Band Neutrophils: 6 %
Basophils Absolute: 0 10*3/uL (ref 0.0–0.1)
Basophils Relative: 0 %
Eosinophils Absolute: 0.3 10*3/uL (ref 0.0–0.5)
Eosinophils Relative: 1 %
HCT: 40.9 % (ref 39.0–52.0)
Hemoglobin: 14 g/dL (ref 13.0–17.0)
Lymphocytes Relative: 7 %
Lymphs Abs: 1.8 10*3/uL (ref 0.7–4.0)
MCH: 31.8 pg (ref 26.0–34.0)
MCHC: 34.2 g/dL (ref 30.0–36.0)
MCV: 93 fL (ref 80.0–100.0)
Metamyelocytes Relative: 3 %
Monocytes Absolute: 1.5 10*3/uL — ABNORMAL HIGH (ref 0.1–1.0)
Monocytes Relative: 6 %
Myelocytes: 3 %
Neutro Abs: 20.1 10*3/uL — ABNORMAL HIGH (ref 1.7–7.7)
Neutrophils Relative %: 74 %
Platelets: 441 10*3/uL — ABNORMAL HIGH (ref 150–400)
RBC: 4.4 MIL/uL (ref 4.22–5.81)
RDW: 13 % (ref 11.5–15.5)
WBC: 25.1 10*3/uL — ABNORMAL HIGH (ref 4.0–10.5)
nRBC: 0.1 % (ref 0.0–0.2)

## 2022-03-15 LAB — LACTIC ACID, PLASMA: Lactic Acid, Venous: 1.2 mmol/L (ref 0.5–1.9)

## 2022-03-15 MED ORDER — SODIUM CHLORIDE 0.9 % IV SOLN
2.0000 g | INTRAVENOUS | Status: DC
  Administered 2022-03-16 – 2022-03-17 (×2): 2 g via INTRAVENOUS
  Filled 2022-03-15 (×3): qty 20

## 2022-03-15 MED ORDER — IOHEXOL 350 MG/ML SOLN
100.0000 mL | Freq: Once | INTRAVENOUS | Status: AC | PRN
  Administered 2022-03-15: 100 mL via INTRAVENOUS

## 2022-03-15 MED ORDER — SODIUM CHLORIDE 0.9 % IV SOLN: INTRAVENOUS | Status: DC

## 2022-03-15 MED ORDER — VANCOMYCIN HCL IN DEXTROSE 1-5 GM/200ML-% IV SOLN: 1000.0000 mg | Freq: Two times a day (BID) | INTRAVENOUS | Status: DC

## 2022-03-15 MED ORDER — ENOXAPARIN SODIUM 40 MG/0.4ML IJ SOSY
40.0000 mg | PREFILLED_SYRINGE | INTRAMUSCULAR | Status: DC
  Administered 2022-03-16 – 2022-03-19 (×4): 40 mg via SUBCUTANEOUS
  Filled 2022-03-15 (×5): qty 0.4

## 2022-03-15 MED ORDER — VANCOMYCIN HCL 500 MG IV SOLR
500.0000 mg | Freq: Once | INTRAVENOUS | Status: DC
  Filled 2022-03-15: qty 10

## 2022-03-15 MED ORDER — SODIUM CHLORIDE 0.9 % IV SOLN: 1.0000 g | Freq: Once | INTRAVENOUS | Status: DC

## 2022-03-15 MED ORDER — METHYLPREDNISOLONE SODIUM SUCC 125 MG IJ SOLR
125.0000 mg | Freq: Once | INTRAMUSCULAR | Status: AC
  Administered 2022-03-15: 125 mg via INTRAVENOUS
  Filled 2022-03-15: qty 2

## 2022-03-15 MED ORDER — SODIUM CHLORIDE 0.9 % IV SOLN
2.0000 g | Freq: Once | INTRAVENOUS | Status: AC
  Administered 2022-03-15: 2 g via INTRAVENOUS
  Filled 2022-03-15: qty 12.5

## 2022-03-15 MED ORDER — VANCOMYCIN HCL IN DEXTROSE 1-5 GM/200ML-% IV SOLN
1000.0000 mg | Freq: Once | INTRAVENOUS | Status: AC
  Administered 2022-03-15: 1000 mg via INTRAVENOUS
  Filled 2022-03-15: qty 200

## 2022-03-15 MED ORDER — SODIUM CHLORIDE 0.9 % IV SOLN
500.0000 mg | INTRAVENOUS | Status: AC
  Administered 2022-03-16 – 2022-03-20 (×5): 500 mg via INTRAVENOUS
  Filled 2022-03-15 (×6): qty 5

## 2022-03-15 MED ORDER — ACETAMINOPHEN 325 MG PO TABS
650.0000 mg | ORAL_TABLET | Freq: Once | ORAL | Status: AC
  Administered 2022-03-15: 650 mg via ORAL
  Filled 2022-03-15: qty 2

## 2022-03-15 NOTE — Progress Notes (Signed)
Pharmacy Antibiotic Note  Maurice [name] is a 72 y.o. male for which pharmacy has been consulted for vancomycin dosing for pneumonia.  Patient with a history of prostate cancer and HLD. Recent admission for pnemonia. Patient presenting with continued fever post-discharge.  SCr 0.88  WBC 25.1; LA 1.21; T 100; HR 86; RR 19  Plan: Cefepime per MD Vancomycin 1500 mg once then 1000 mg q12hr (eAUC 484.7) unless change in renal function Trend WBC, Fever, Renal function F/u cultures, clinical course, WBC De-escalate when able Levels at steady state F/u MRSA PCR     Temp (24hrs), Avg:99.5 F (37.5 C), Min:99 F (37.2 C), Max:100 F (37.8 C)  Recent Labs  Lab 03/08/22 2320 03/10/22 1205 03/10/22 2047 03/11/22 0202 03/11/22 0507 03/11/22 0820 03/12/22 0405 03/15/22 1733 03/15/22 1832  WBC 11.3* 8.0  --  5.4  --   --  12.7* 25.1*  --   CREATININE 1.14 0.80  --  0.98  --   --  0.89 0.88  --   LATICACIDVEN 1.6  --  2.9* 1.9 1.2 1.7  --   --  1.2    Estimated Creatinine Clearance: 77 mL/min (by C-G formula based on SCr of 0.88 mg/dL).    No Known Allergies  Antimicrobials this admission: cefepime 1/8 >>  vancomycin 1/8 >>   Microbiology results: Pending  Thank you for allowing pharmacy to be a part of this patient's care.  Lorelei Pont, PharmD, BCPS 03/15/2022 9:06 PM ED Clinical Pharmacist -  [phone]

## 2022-03-15 NOTE — Plan of Care (Signed)
   Patient Name: daylyn, azbill DOB: 06/02/1950 MRN: [id number] Transferring facility: [facility] Requesting provider: zackowski, md Reason for transfer:  72 yo WM with SOB, hypoxia(88%). discharged on 03-12-2021 for RSV pneumonia. discharged to home with augmentin/zithromax. increased SOB at home.  now on O2 @ 2 L/min. CTPA negative for PE. has continued multifocal pneumonia. WBC 25K. had IV solumedrol 40 mg on 03-11-2022 and 03-12-2022. on my read, pna on CT looks the same as it did on 03-10-2022.  ED cannot arrange for [facility] at [facility]. Going to:[facility] Admission Status: observation Bed Type: med/surg To Do: will need home O2 then probably discharge to home.  [facility] will assume care on arrival to accepting facility. Until arrival, medical decision making responsibilities remain with the EDP.  However, [facility] available 24/7 for questions and assistance.   Nursing staff please page [place] and Consults [phone]) as soon as the patient arrives to the hospital.  Kristopher Oppenheim, DO [facility]

## 2022-03-15 NOTE — ED Triage Notes (Signed)
Pt arrives to ED with c/o continued fever post discharge from the hospital for pneumonia, RSV, and severe sepsis. He also notes low SpO2 levels at home ranging from 89-92%.

## 2022-03-15 NOTE — ED Provider Notes (Signed)
[place] EMERGENCY DEPT Provider Note   CSN: [id number] Arrival date & time: 03/15/22  1724     History  Chief Complaint  Patient presents with   Pneumonia   Fever    Maurice [name] is a 72 y.o. male.  Patient was admitted January 3 discharged January 5 for pneumonia may have been pneumonia secondary to RSV because he was RSV positive.  Patient had a sepsis workup while in the hospital everything appeared negative.  Discharged home on Zithromycin and Augmentin.  Patient felt pretty good the day he was discharged home.  But since that time has not felt well.  Feels short of breath.  Oxygen sats will go down to 87% at rest.  Patient does not have oxygen available at home.  He does not necessarily feel significantly worse but does not feel like he is getting any better.  We witnessed his oxygen sats going down to 87% on room air in the room at rest.  Past medical history significant for prostate cancer.  He has radioactive seeds placed.  Followed by Dr. Tammi Klippel.  Patient still having fevers at home.  But there is seem to be low-grade.  Patient is taking Tylenol for that.       Home Medications Prior to Admission medications   Medication Sig Start Date End Date Taking? Authorizing Provider  amoxicillin-clavulanate (AUGMENTIN) 875-125 MG tablet Take 1 tablet by mouth 2 (two) times daily for 3 days. 03/12/22 03/15/22  Mercy Riding, MD  azithromycin (ZITHROMAX) 250 MG tablet Take 1 tablet (250 mg total) by mouth daily for 3 days. 03/12/22 03/15/22  Mercy Riding, MD  Calcium Carbonate Antacid (TUMS PO) Take 3 tablets by mouth as needed (reflux).    [provider]  guaiFENesin (MUCINEX) 600 MG 12 hr tablet Take 1 tablet (600 mg total) by mouth 2 (two) times daily for 5 days. 03/12/22 03/17/22  Mercy Riding, MD  ibuprofen (ADVIL) 200 MG tablet Take 400 mg by mouth daily as needed for mild pain.    [provider]  oxymetazoline (AFRIN) 0.05 % nasal spray Place 1-5 sprays  into both nostrils at bedtime as needed for congestion.    [provider]  PRESCRIPTION MEDICATION Place 1 drop into the right eye in the morning, at noon, and at bedtime. Unknown cataract eye drop    [provider]  sildenafil (VIAGRA) 100 MG tablet Take 100 mg by mouth daily as needed for erectile dysfunction.    [provider]      Allergies    Patient has no known allergies.    Review of Systems   Review of Systems  Constitutional:  Positive for fever. Negative for chills.  HENT:  Negative for rhinorrhea and sore throat.   Eyes:  Negative for visual disturbance.  Respiratory:  Positive for cough and shortness of breath.   Cardiovascular:  Negative for chest pain and leg swelling.  Gastrointestinal:  Negative for abdominal pain, diarrhea, nausea and vomiting.  Genitourinary:  Negative for dysuria.  Musculoskeletal:  Negative for back pain and neck pain.  Skin:  Negative for rash.  Neurological:  Negative for dizziness, light-headedness and headaches.  Hematological:  Does not bruise/bleed easily.  Psychiatric/Behavioral:  Negative for confusion.     Physical Exam Updated Vital Signs BP 139/86   Pulse 84   Temp 99 F (37.2 C) (Oral)   Resp 20   SpO2 93%  Physical Exam Vitals and nursing note reviewed.  Constitutional:  General: He is not in acute distress.    Appearance: Normal appearance. He is well-developed.  HENT:     Head: Normocephalic and atraumatic.     Mouth/Throat:     Mouth: Mucous membranes are moist.  Eyes:     Extraocular Movements: Extraocular movements intact.     Conjunctiva/sclera: Conjunctivae normal.     Pupils: Pupils are equal, round, and reactive to light.  Cardiovascular:     Rate and Rhythm: Normal rate and regular rhythm.     Heart sounds: No murmur heard. Pulmonary:     Effort: Pulmonary effort is normal. No respiratory distress.     Breath sounds: Normal breath sounds. No wheezing, rhonchi or rales.   Abdominal:     Palpations: Abdomen is soft.     Tenderness: There is no abdominal tenderness.  Musculoskeletal:        General: No swelling.     Cervical back: Normal range of motion and neck supple.  Skin:    General: Skin is warm and dry.     Capillary Refill: Capillary refill takes less than 2 seconds.  Neurological:     General: No focal deficit present.     Mental Status: He is alert and oriented to person, place, and time.     Cranial Nerves: No cranial nerve deficit.     Sensory: No sensory deficit.     Motor: No weakness.  Psychiatric:        Mood and Affect: Mood normal.     ED Results / Procedures / Treatments   Labs (all labs ordered are listed, but only abnormal results are displayed) Labs Reviewed  CBC WITH DIFFERENTIAL/PLATELET - Abnormal; Notable for the following components:      Result Value   WBC 25.1 (*)    Platelets 441 (*)    Neutro Abs 20.1 (*)    Monocytes Absolute 1.5 (*)    Abs Immature Granulocytes 1.50 (*)    All other components within normal limits  COMPREHENSIVE METABOLIC PANEL - Abnormal; Notable for the following components:   Glucose, Bld 113 (*)    Calcium 8.7 (*)    AST 53 (*)    ALT 111 (*)    All other components within normal limits  CULTURE, BLOOD (ROUTINE X 2)  CULTURE, BLOOD (ROUTINE X 2)  LACTIC ACID, PLASMA  LACTIC ACID, PLASMA    EKG None  Radiology CT Angio Chest PE W/Cm &/Or Wo Cm  Result Date: 03/15/2022 CLINICAL DATA:  High probability for PE.  Sepsis. EXAM: CT ANGIOGRAPHY CHEST WITH CONTRAST TECHNIQUE: Multidetector CT imaging of the chest was performed using the standard protocol during bolus administration of intravenous contrast. Multiplanar CT image reconstructions and MIPs were obtained to evaluate the vascular anatomy. RADIATION DOSE REDUCTION: This exam was performed according to the departmental dose-optimization program which includes automated exposure control, adjustment of the mA and/or kV according to  patient size and/or use of iterative reconstruction technique. CONTRAST:  146m OMNIPAQUE IOHEXOL 350 MG/ML SOLN COMPARISON:  CT of the chest 03/10/2022 FINDINGS: Cardiovascular: Satisfactory opacification of the pulmonary arteries to the segmental level. No evidence of pulmonary embolism. Normal heart size. No pericardial effusion. Mediastinum/Nodes: No enlarged mediastinal, hilar, or axillary lymph nodes. Thyroid gland, trachea, and esophagus demonstrate no significant findings. Lungs/Pleura: There are multifocal patchy airspace and ground-glass opacities in the bilateral lower lobes and minimally in the bilateral upper lobes, right greater than left. Is no pleural effusion or pneumothorax. Trachea and central airways are  patent. Upper Abdomen: No acute abnormality. Musculoskeletal: No chest wall abnormality. No acute or significant osseous findings. Review of the MIP images confirms the above findings. IMPRESSION: 1. No evidence for pulmonary embolism. 2. Multifocal patchy airspace and ground-glass opacities in the bilateral lower lobes and minimally in the bilateral upper lobes, right greater than left. Findings are likely infectious/inflammatory. Electronically Signed   By: Ronney Asters M.D.   On: 03/15/2022 19:38   DG Chest Port 1 View  Result Date: 03/15/2022 CLINICAL DATA:  Fever. EXAM: PORTABLE CHEST 1 VIEW COMPARISON:  March 10, 2022. FINDINGS: The heart size and mediastinal contours are within normal limits. Both lungs are clear. Degenerative changes are seen involving the glenohumeral joints bilaterally. IMPRESSION: No active disease. Electronically Signed   By: Marijo Conception M.D.   On: 03/15/2022 18:15    Procedures Procedures    Medications Ordered in ED Medications  0.9 %  sodium chloride infusion ( Intravenous New Bag/Given 03/15/22 1842)  ceFEPIme (MAXIPIME) 1 g in sodium chloride 0.9 % 100 mL IVPB (has no administration in time range)  methylPREDNISolone sodium succinate  (SOLU-MEDROL) 125 mg/2 mL injection 125 mg (125 mg Intravenous Given 03/15/22 1843)  iohexol (OMNIPAQUE) 350 MG/ML injection 100 mL (100 mLs Intravenous Contrast Given 03/15/22 1912)    ED Course/ Medical Decision Making/ A&P                           Medical Decision Making Amount and/or Complexity of Data Reviewed Labs: ordered. Radiology: ordered.  Risk OTC drugs. Prescription drug management. Decision regarding hospitalization.   CRITICAL CARE Performed by: Fredia Sorrow Total critical care time: 45 minutes Critical care time was exclusive of separately billable procedures and treating other patients. Critical care was necessary to treat or prevent imminent or life-threatening deterioration. Critical care was time spent personally by me on the following activities: development of treatment plan with patient and/or surrogate as well as nursing, discussions with consultants, evaluation of patient's response to treatment, examination of patient, obtaining history from patient or surrogate, ordering and performing treatments and interventions, ordering and review of laboratory studies, ordering and review of radiographic studies, pulse oximetry and re-evaluation of patient's condition.  Patient doing much better on 2 L of oxygen.  Sats are around 93%.  Regular chest x-ray without any acute findings.  Did have a good explanation for the hypoxia so did CT angio no evidence of pulmonary embolism.  However this did show multifocal pneumonia.  Patient's white blood cell count is elevated from when he was discharged from the hospital was 12,000 at that time was 25,000 today raising some concerns about may be bacterial pneumonia.  However he was given steroids at least once while in the hospital best I can tell also could have bumped due to that.  His platelets are 441.  Hemoglobin is good at 14.  Complete metabolic panel liver function test without significant abnormalities other than AST of  53 ALT 111 alk phos is normal total bili is normal renal functions normal.  Potassium normal.  Cultures were done and are pending.  Nursing must have ordered respiratory panel again.  I did not order that.  Patient's lactic acid normal at 1.2.  Vital signs without septic parameters.  Temp on arrival was 99.  The highest temp we have gotten here is 100.  Patient states the highest he got at home was 100.2.  Heart rate 96 and blood pressure 149/83.  But due to the elevated white count and the multifocal pneumonia.  Will increase antibiotic coverage for possible hospital-acquired pneumonia.  With cefepime and vancomycin.  However my clinical suspicion is that this is all RSV related.  And the main concern is that patient probably needs home oxygen.  However due to the hypoxia does require readmission at this time.  Will discuss with hospitalist.  Patient was not discharged home on oxygen.  Final Clinical Impression(s) / ED Diagnoses Final diagnoses:  RSV (respiratory syncytial virus pneumonia)  Hypoxia  Multifocal pneumonia    Rx / DC Orders ED Discharge Orders     None         Fredia Sorrow, MD 03/15/22 2110

## 2022-03-16 DIAGNOSIS — J121 Respiratory syncytial virus pneumonia: Secondary | ICD-10-CM | POA: Diagnosis not present

## 2022-03-16 DIAGNOSIS — C61 Malignant neoplasm of prostate: Secondary | ICD-10-CM | POA: Diagnosis present

## 2022-03-16 DIAGNOSIS — B001 Herpesviral vesicular dermatitis: Secondary | ICD-10-CM | POA: Diagnosis present

## 2022-03-16 DIAGNOSIS — J189 Pneumonia, unspecified organism: Secondary | ICD-10-CM

## 2022-03-16 DIAGNOSIS — J9601 Acute respiratory failure with hypoxia: Secondary | ICD-10-CM | POA: Diagnosis not present

## 2022-03-16 DIAGNOSIS — R7401 Elevation of levels of liver transaminase levels: Secondary | ICD-10-CM | POA: Diagnosis present

## 2022-03-16 DIAGNOSIS — Z79899 Other long term (current) drug therapy: Secondary | ICD-10-CM | POA: Diagnosis not present

## 2022-03-16 DIAGNOSIS — R0902 Hypoxemia: Secondary | ICD-10-CM

## 2022-03-16 DIAGNOSIS — Z8261 Family history of arthritis: Secondary | ICD-10-CM | POA: Diagnosis not present

## 2022-03-16 DIAGNOSIS — J309 Allergic rhinitis, unspecified: Secondary | ICD-10-CM | POA: Diagnosis present

## 2022-03-16 DIAGNOSIS — Z1152 Encounter for screening for COVID-19: Secondary | ICD-10-CM | POA: Diagnosis not present

## 2022-03-16 DIAGNOSIS — R509 Fever, unspecified: Secondary | ICD-10-CM | POA: Diagnosis present

## 2022-03-16 DIAGNOSIS — M1990 Unspecified osteoarthritis, unspecified site: Secondary | ICD-10-CM | POA: Diagnosis present

## 2022-03-16 LAB — COMPREHENSIVE METABOLIC PANEL
ALT: 95 U/L — ABNORMAL HIGH (ref 0–44)
AST: 36 U/L (ref 15–41)
Albumin: 2.6 g/dL — ABNORMAL LOW (ref 3.5–5.0)
Alkaline Phosphatase: 63 U/L (ref 38–126)
Anion gap: 8 (ref 5–15)
BUN: 17 mg/dL (ref 8–23)
CO2: 20 mmol/L — ABNORMAL LOW (ref 22–32)
Calcium: 7.9 mg/dL — ABNORMAL LOW (ref 8.9–10.3)
Chloride: 106 mmol/L (ref 98–111)
Creatinine, Ser: 0.74 mg/dL (ref 0.61–1.24)
GFR, Estimated: >60 mL/min (ref >60)
Glucose, Bld: 166 mg/dL — ABNORMAL HIGH (ref 70–99)
Potassium: 3.9 mmol/L (ref 3.5–5.1)
Sodium: 134 mmol/L — ABNORMAL LOW (ref 135–145)
Total Bilirubin: 0.4 mg/dL (ref 0.3–1.2)
Total Protein: 6.4 g/dL — ABNORMAL LOW (ref 6.5–8.1)

## 2022-03-16 LAB — CULTURE, BLOOD (ROUTINE X 2)
Culture: NO GROWTH
Culture: NO GROWTH
Special Requests: ADEQUATE
Special Requests: ADEQUATE

## 2022-03-16 LAB — CBC
HCT: 35.7 % — ABNORMAL LOW (ref 39.0–52.0)
Hemoglobin: 12.3 g/dL — ABNORMAL LOW (ref 13.0–17.0)
MCH: 32.1 pg (ref 26.0–34.0)
MCHC: 34.5 g/dL (ref 30.0–36.0)
MCV: 93.2 fL (ref 80.0–100.0)
Platelets: 427 10*3/uL — ABNORMAL HIGH (ref 150–400)
RBC: 3.83 MIL/uL — ABNORMAL LOW (ref 4.22–5.81)
RDW: 13 % (ref 11.5–15.5)
WBC: 24.7 10*3/uL — ABNORMAL HIGH (ref 4.0–10.5)
nRBC: 0 % (ref 0.0–0.2)

## 2022-03-16 LAB — PROCALCITONIN: Procalcitonin: 0.42 ng/mL

## 2022-03-16 LAB — EXPECTORATED SPUTUM ASSESSMENT W GRAM STAIN, RFLX TO RESP C

## 2022-03-16 LAB — MRSA NEXT GEN BY PCR, NASAL: MRSA by PCR Next Gen: NOT DETECTED

## 2022-03-16 LAB — STREP PNEUMONIAE URINARY ANTIGEN: Strep Pneumo Urinary Antigen: NEGATIVE

## 2022-03-16 LAB — LACTIC ACID, PLASMA: Lactic Acid, Venous: 1.1 mmol/L (ref 0.5–1.9)

## 2022-03-16 LAB — HIV ANTIBODY (ROUTINE TESTING W REFLEX): HIV Screen 4th Generation wRfx: NONREACTIVE

## 2022-03-16 MED ORDER — VANCOMYCIN HCL 500 MG/100ML IV SOLN
500.0000 mg | Freq: Once | INTRAVENOUS | Status: AC
  Administered 2022-03-16: 500 mg via INTRAVENOUS
  Filled 2022-03-16: qty 100

## 2022-03-16 NOTE — H&P (Signed)
History and Physical    Patient: Maurice [name] [id number]:[id number] DOB: [birth year] DOA: 03/15/2022 DOS: the patient was seen and examined on 03/16/2022 PCP: [name], MD  Patient coming from: Home  Chief Complaint:  Chief Complaint  Patient presents with   Pneumonia   Fever   HPI: Maurice [name] is a 72 y.o. male with medical history significant of prostate CA s/p radioactive seed implant.  Pt recently admitted 1/3-1/5 for PNA, was positive for RSV at that time.  Still discharged home on zithromax and augmentin.  Pt felt pretty good on the day of discharge but not felt well since that time.  SOB, O2 sats 87% at rest (not on oxygen at baseline and doesn't have this at home).  Doesn't feel worse per say but doesn't feel like he's getting better.  Still having low grade fevers at home.  Back in to ED today  Review of Systems: As mentioned in the history of present illness. All other systems reviewed and are negative. Past Medical History:  Diagnosis Date   Allergic rhinitis    Allergy    Arthritis    Plantar fasciitis, left    Past Surgical History:  Procedure Laterality Date   APPENDECTOMY     TONSILLECTOMY     Social History:  reports that he has never smoked. He has never used smokeless tobacco. He reports that he does not drink alcohol and does not use drugs.  No Known Allergies  Family History  Problem Relation Age of Onset   Arthritis Father    Cancer Father    Allergic rhinitis Father    Asthma Father     Prior to Admission medications   Medication Sig Start Date End Date Taking? Authorizing Provider  Calcium Carbonate Antacid (TUMS PO) Take 3 tablets by mouth as needed (reflux).    [provider]  guaiFENesin (MUCINEX) 600 MG 12 hr tablet Take 1 tablet (600 mg total) by mouth 2 (two) times daily for 5 days. 03/12/22 03/17/22  Mercy Riding, MD  ibuprofen (ADVIL) 200 MG tablet Take 400 mg by mouth daily as needed for mild pain.    [provider]   oxymetazoline (AFRIN) 0.05 % nasal spray Place 1-5 sprays into both nostrils at bedtime as needed for congestion.    [provider]  PRESCRIPTION MEDICATION Place 1 drop into the right eye in the morning, at noon, and at bedtime. Unknown cataract eye drop    [provider]  sildenafil (VIAGRA) 100 MG tablet Take 100 mg by mouth daily as needed for erectile dysfunction.    [provider]    Physical Exam: Vitals:   03/15/22 2200 03/15/22 2220 03/15/22 2259 03/16/22 0305  BP: (!) 155/82 (!) 160/96 (!) 156/85 127/68  Pulse: 89 87 88 80  Resp: 16 (!) '26 20 14  '$ Temp:   99.2 F (37.3 C) 98.6 F (37 C)  TempSrc:   Oral Oral  SpO2: 94% 92%  92%   Constitutional: NAD, calm, comfortable Eyes: PERRL, lids and conjunctivae normal ENMT: Mucous membranes are moist. Posterior pharynx clear of any exudate or lesions.Normal dentition.  Neck: normal, supple, no masses, no thyromegaly Respiratory: clear to auscultation bilaterally, no wheezing, no crackles. Normal respiratory effort. No accessory muscle use.  Cardiovascular: Regular rate and rhythm, no murmurs / rubs / gallops. No extremity edema. 2+ pedal pulses. No carotid bruits.  Abdomen: no tenderness, no masses palpated. No hepatosplenomegaly. Bowel sounds positive.  Musculoskeletal: no clubbing / cyanosis. No  joint deformity upper and lower extremities. Good ROM, no contractures. Normal muscle tone.  Skin: no rashes, lesions, ulcers. No induration Neurologic: CN 2-12 grossly intact. Sensation intact, DTR normal. Strength 5/5 in all 4.  Psychiatric: Normal judgment and insight. Alert and oriented x 3. Normal mood.   Data Reviewed:     CTA chest: IMPRESSION: 1. No evidence for pulmonary embolism. 2. Multifocal patchy airspace and ground-glass opacities in the bilateral lower lobes and minimally in the bilateral upper lobes, right greater than left. Findings are likely infectious/inflammatory.     Latest Ref  Rng & Units 03/16/2022    3:08 AM 03/15/2022    5:33 PM 03/12/2022    4:05 AM  CBC  WBC 4.0 - 10.5 K/uL 24.7  25.1  12.7   Hemoglobin 13.0 - 17.0 g/dL 12.3  14.0  12.9   Hematocrit 39.0 - 52.0 % 35.7  40.9  37.5   Platelets 150 - 400 K/uL 427  441  288       Latest Ref Rng & Units 03/16/2022    3:08 AM 03/15/2022    5:33 PM 03/12/2022    4:05 AM  CMP  Glucose 70 - 99 mg/dL 166  113  136   BUN 8 - 23 mg/dL '17  15  17   '$ Creatinine 0.61 - 1.24 mg/dL 0.74  0.88  0.89   Sodium 135 - 145 mmol/L 134  136  134   Potassium 3.5 - 5.1 mmol/L 3.9  3.9  4.3   Chloride 98 - 111 mmol/L 106  103  105   CO2 22 - 32 mmol/L '20  23  21   '$ Calcium 8.9 - 10.3 mg/dL 7.9  8.7  8.4   Total Protein 6.5 - 8.1 g/dL 6.4  6.8    Total Bilirubin 0.3 - 1.2 mg/dL 0.4  0.5    Alkaline Phos 38 - 126 U/L 63  65    AST 15 - 41 U/L 36  53    ALT 0 - 44 U/L 95  111     Covid, flu, and RSV all negative  Assessment and Plan: * CAP (community acquired pneumonia) Multifocal PNA Suspect bacterial PNA at this point with WBC 25k, now testing RSV negative on repeat RVP today (dont think EDP had this data point at time of his note) Giving rising WBC, question if this shouldn't be considered failure of augmentin/zithromax at this point? PNA pathway Got cefepime + vanc in ED Will leave on rocephin, azithromycin, vanc for now MRSA PCR nares pending Stop vanc if negative Repeat s.pneumo urinary antigen  Acute respiratory failure with hypoxia ([place]) Pt with new O2 requirement of 2L. Cont pulse ox  RSV (respiratory syncytial virus pneumonia) Now testing RSV negative. I think he had RSV previously, but now has superimposed bacterial pneumonia driving his symptoms and admission today.  Prostate cancer [facility]) S/p radioactive seed implant.      Advance Care Planning:   Code Status: Full Code  Consults: None  Family Communication: No family in room  Severity of Illness: The appropriate patient status for this patient is  OBSERVATION. Observation status is judged to be reasonable and necessary in order to provide the required intensity of service to ensure the patient's safety. The patient's presenting symptoms, physical exam findings, and initial radiographic and laboratory data in the context of their medical condition is felt to place them at decreased risk for further clinical deterioration. Furthermore, it is anticipated that the patient will be  medically stable for discharge from the hospital within 2 midnights of admission.   Author: Etta Quill., DO 03/16/2022 5:10 AM  For on call review [email or web address].

## 2022-03-16 NOTE — Assessment & Plan Note (Signed)
Respiratory failure ruled out.

## 2022-03-16 NOTE — Hospital Course (Signed)
Maurice [name] is a 72 y.o. M with pros CA and recent admission for RSV who presented with malaise, SOB.

## 2022-03-16 NOTE — Assessment & Plan Note (Signed)
Pt with new O2 requirement of 2L. Cont pulse ox

## 2022-03-16 NOTE — Assessment & Plan Note (Addendum)
Patient admitted with RSV and mulifocal pneumonia 1 week ago, treated with Rocephin, azithromycin and steroids. Procalcitonin 3.2 ng/mL during that hospitalization.  Went home finished 5 days Augmentin/azithro, felt gradually worse, returned with WBC 25K, requring O2 again, CXR with bilateral opacities still.  I suspect just a treatment failure, given HR still 109 on day of discharge, temp 101F within 24 hours of d/c.  Procal here 0.4 > 0.7, sputum culture gram stain positive - Continue Rocephin and azithromycin - Aggressive pulmonary toilet - MRSA nares negative, stop vanc - Follow sputum culture

## 2022-03-16 NOTE — Progress Notes (Signed)
    Patient: Maurice [name] [id number] DOB: 1950-06-10      Brief hospital course: [name] is a 72 y.o. M with pros CA and recent admission for RSV who presented with malaise, SOB.    This is a no charge note, for further details, please see the H&P by my partner, Dr. Alcario Drought from earlier today.    Principal Problem:   CAP (community acquired pneumonia) Active Problems:   Prostate cancer ([place])   RSV (respiratory syncytial virus pneumonia)   Hypoxia   Transaminitis  Pneumonia, either residual from RSV or from CAP.  On antibiotics, procal pending.         Physical Exam: BP 137/81 (BP Location: Left Arm)   Pulse 83   Temp 99.6 F (37.6 C) (Oral)   Resp 18   SpO2 92%      Family Communication: Wife at bedside        Author: Edwin Dada, MD 03/16/2022 4:33 PM

## 2022-03-17 DIAGNOSIS — R7401 Elevation of levels of liver transaminase levels: Secondary | ICD-10-CM | POA: Diagnosis not present

## 2022-03-17 DIAGNOSIS — C61 Malignant neoplasm of prostate: Secondary | ICD-10-CM | POA: Diagnosis not present

## 2022-03-17 DIAGNOSIS — J189 Pneumonia, unspecified organism: Secondary | ICD-10-CM | POA: Diagnosis not present

## 2022-03-17 LAB — COMPREHENSIVE METABOLIC PANEL
ALT: 129 U/L — ABNORMAL HIGH (ref 0–44)
AST: 57 U/L — ABNORMAL HIGH (ref 15–41)
Albumin: 2.6 g/dL — ABNORMAL LOW (ref 3.5–5.0)
Alkaline Phosphatase: 59 U/L (ref 38–126)
Anion gap: 8 (ref 5–15)
BUN: 17 mg/dL (ref 8–23)
CO2: 22 mmol/L (ref 22–32)
Calcium: 8.3 mg/dL — ABNORMAL LOW (ref 8.9–10.3)
Chloride: 106 mmol/L (ref 98–111)
Creatinine, Ser: 0.84 mg/dL (ref 0.61–1.24)
GFR, Estimated: >60 mL/min (ref >60)
Glucose, Bld: 101 mg/dL — ABNORMAL HIGH (ref 70–99)
Potassium: 4.2 mmol/L (ref 3.5–5.1)
Sodium: 136 mmol/L (ref 135–145)
Total Bilirubin: 0.5 mg/dL (ref 0.3–1.2)
Total Protein: 6.2 g/dL — ABNORMAL LOW (ref 6.5–8.1)

## 2022-03-17 LAB — CBC
HCT: 40.7 % (ref 39.0–52.0)
Hemoglobin: 13.4 g/dL (ref 13.0–17.0)
MCH: 31.5 pg (ref 26.0–34.0)
MCHC: 32.9 g/dL (ref 30.0–36.0)
MCV: 95.5 fL (ref 80.0–100.0)
Platelets: 476 10*3/uL — ABNORMAL HIGH (ref 150–400)
RBC: 4.26 MIL/uL (ref 4.22–5.81)
RDW: 13.2 % (ref 11.5–15.5)
WBC: 15.7 10*3/uL — ABNORMAL HIGH (ref 4.0–10.5)
nRBC: 0 % (ref 0.0–0.2)

## 2022-03-17 LAB — PROCALCITONIN: Procalcitonin: 0.71 ng/mL

## 2022-03-17 MED ORDER — MELATONIN 3 MG PO TABS
3.0000 mg | ORAL_TABLET | Freq: Once | ORAL | Status: AC
  Administered 2022-03-17: 3 mg via ORAL
  Filled 2022-03-17: qty 1

## 2022-03-17 MED ORDER — TRAZODONE HCL 50 MG PO TABS
50.0000 mg | ORAL_TABLET | Freq: Every evening | ORAL | Status: DC | PRN
  Administered 2022-03-17: 50 mg via ORAL
  Filled 2022-03-17: qty 1

## 2022-03-17 NOTE — Progress Notes (Signed)
  Transition of Care [facility]) Screening Note   Patient Details  Name: Maurice [name] Date of Birth: 1950-12-28   Transition of Care [facility]) CM/SW Contact:    Henrietta Dine, RN Phone Number: 03/17/2022, 8:33 AM    Transition of Care Department [facility]) has reviewed patient and no TOC needs have been identified at this time. We will continue to monitor patient advancement through interdisciplinary progression rounds. If new patient transition needs arise, please place a TOC consult.

## 2022-03-17 NOTE — Progress Notes (Signed)
  Progress Note   Patient: Maurice [name] [id number] DOB: 03/20/1950 DOA: 03/15/2022     1 DOS: the patient was seen and examined on 03/17/2022 at 9:49AM      Brief hospital course: Mr. Holland is a 72 y.o. M with pros CA and recent admission for RSV who presented with malaise, SOB.        Assessment and Plan: * CAP (community acquired pneumonia) Patient admitted with RSV and mulifocal pneumonia 1 week ago, treated with Rocephin, azithromycin and steroids. Procalcitonin 3.2 ng/mL during that hospitalization.  Went home finished 5 days Augmentin/azithro, felt gradually worse, returned with WBC 25K, requring O2 again, CXR with bilateral opacities still.  I suspect just a treatment failure, given HR still 109 on day of discharge, temp 101F within 24 hours of d/c.  Procal here 0.4 > 0.7, sputum culture gram stain positive - Continue Rocephin and azithromycin - Aggressive pulmonary toilet - MRSA nares negative, stop vanc - Follow sputum culture    Transaminitis Mild - Trend LFTs  Hypoxia Respiratory failure ruled out.  RSV (respiratory syncytial virus pneumonia)    Prostate cancer ([place])            Subjective: Patient still having some cough, mild bodyaches, little bit of fever.  No confusion, chest pain, dyspnea.     Physical Exam: BP (!) 141/80 (BP Location: Right Arm)   Pulse 71   Temp 99.1 F (37.3 C) (Oral)   Resp 14   SpO2 92%   Adult male, lying in bed, no acute distress RRR, no murmurs, no peripheral edema Respiratory rate normal, coarse breath sounds bilaterally but improved from yesterday, no wheezing Attention normal, affect normal, judgment insight appear normal     Data Reviewed: Procalcitonin slightly up, 0.7 Patient metabolic panel unremarkable LFTs trended up, total bilirubin normal White count improved  Family Communication: None present    Disposition: Status is: Inpatient Patient still requires new supplemental oxygen, we will  continue antibiotics etc.        Author: Edwin Dada, MD 03/17/2022 1:06 PM  For on call review [email or web address].

## 2022-03-17 NOTE — Assessment & Plan Note (Signed)
Mild - Trend LFTs

## 2022-03-18 DIAGNOSIS — J340 Abscess, furuncle and carbuncle of nose: Secondary | ICD-10-CM | POA: Insufficient documentation

## 2022-03-18 DIAGNOSIS — C61 Malignant neoplasm of prostate: Secondary | ICD-10-CM | POA: Diagnosis not present

## 2022-03-18 DIAGNOSIS — R7401 Elevation of levels of liver transaminase levels: Secondary | ICD-10-CM | POA: Diagnosis not present

## 2022-03-18 DIAGNOSIS — J189 Pneumonia, unspecified organism: Secondary | ICD-10-CM | POA: Diagnosis not present

## 2022-03-18 LAB — CBC
HCT: 40.3 % (ref 39.0–52.0)
Hemoglobin: 13.2 g/dL (ref 13.0–17.0)
MCH: 31.1 pg (ref 26.0–34.0)
MCHC: 32.8 g/dL (ref 30.0–36.0)
MCV: 94.8 fL (ref 80.0–100.0)
Platelets: 474 10*3/uL — ABNORMAL HIGH (ref 150–400)
RBC: 4.25 MIL/uL (ref 4.22–5.81)
RDW: 13.1 % (ref 11.5–15.5)
WBC: 14 10*3/uL — ABNORMAL HIGH (ref 4.0–10.5)
nRBC: 0 % (ref 0.0–0.2)

## 2022-03-18 LAB — BASIC METABOLIC PANEL
Anion gap: 8 (ref 5–15)
BUN: 16 mg/dL (ref 8–23)
CO2: 22 mmol/L (ref 22–32)
Calcium: 8.1 mg/dL — ABNORMAL LOW (ref 8.9–10.3)
Chloride: 103 mmol/L (ref 98–111)
Creatinine, Ser: 0.79 mg/dL (ref 0.61–1.24)
GFR, Estimated: >60 mL/min (ref >60)
Glucose, Bld: 95 mg/dL (ref 70–99)
Potassium: 3.9 mmol/L (ref 3.5–5.1)
Sodium: 133 mmol/L — ABNORMAL LOW (ref 135–145)

## 2022-03-18 LAB — PROCALCITONIN: Procalcitonin: 1.09 ng/mL

## 2022-03-18 MED ORDER — ACETAMINOPHEN 325 MG PO TABS
650.0000 mg | ORAL_TABLET | Freq: Four times a day (QID) | ORAL | Status: DC | PRN
  Administered 2022-03-18 – 2022-03-19 (×2): 650 mg via ORAL
  Filled 2022-03-18: qty 2

## 2022-03-18 MED ORDER — ZOLPIDEM TARTRATE 5 MG PO TABS: 5.0000 mg | ORAL_TABLET | Freq: Every evening | ORAL | Status: DC | PRN

## 2022-03-18 MED ORDER — TRAZODONE HCL 50 MG PO TABS
50.0000 mg | ORAL_TABLET | Freq: Every evening | ORAL | Status: DC | PRN
  Administered 2022-03-18 – 2022-03-19 (×2): 50 mg via ORAL
  Filled 2022-03-18 (×2): qty 1

## 2022-03-18 MED ORDER — VALACYCLOVIR HCL 500 MG PO TABS
500.0000 mg | ORAL_TABLET | Freq: Three times a day (TID) | ORAL | Status: DC
  Administered 2022-03-18 – 2022-03-20 (×6): 500 mg via ORAL
  Filled 2022-03-18 (×7): qty 1

## 2022-03-18 MED ORDER — SODIUM CHLORIDE 0.9 % IV SOLN
2.0000 g | Freq: Three times a day (TID) | INTRAVENOUS | Status: DC
  Administered 2022-03-18 – 2022-03-20 (×7): 2 g via INTRAVENOUS
  Filled 2022-03-18 (×7): qty 12.5

## 2022-03-18 NOTE — Progress Notes (Signed)
  Progress Note   Patient: Maurice [name] [id number] DOB: 1951/02/09 DOA: 03/15/2022     2 DOS: the patient was seen and examined on 03/18/2022 at 10:00AM      Brief hospital course: Maurice [name] is a 72 y.o. M with pros CA and recent admission for RSV who presented with malaise, SOB.        Assessment and Plan: * CAP (community acquired pneumonia) Pro-Cal slightly up, white blood cells improving, still requiring oxygen, still with night sweats.  Sputum culture Gram stain positive for gram-negative rods. - Broaden to cefepime - Continue azithromycin    Cold sores These are progressing over the last few days, now in nose, painful - Start Valtrex - HSV swab of nasal ulcer        Subjective: Patient without clinical improvement yet.  Still with sweats, dyspnea, cough and sputum production.     Physical Exam: BP 129/78 (BP Location: Left Arm)   Pulse 68   Temp 98.7 F (37.1 C) (Oral)   Resp 18   SpO2 91%   Thin adult male, lying in bed, no acute distress, RRR, no murmurs, no peripheral edema, respiratory rate seems normal, there is some crusted lesions on his lower lip, upper lip, and left nare. On auscultation of the lungs, could hear some coarse crackles bilaterally Attention normal, affect appropriate, judgment insight appear normal    Data Reviewed: Procalcitonin up to 1.09 White blood cell count down to 14  Family Communication:     Disposition: Status is: Inpatient         Author: Edwin Dada, MD 03/18/2022 11:55 AM  For on call review [email or web address].

## 2022-03-18 NOTE — Progress Notes (Signed)
Mobility Specialist - Progress Note   03/18/22 0914  Oxygen Therapy  SpO2 91 %  O2 Device Room Air  Mobility  Activity Ambulated with assistance in hallway  Level of Assistance Modified independent, requires aide device or extra time  Assistive Device None  Distance Ambulated (ft) 500 ft  Activity Response Tolerated well  Mobility Referral Yes  $Mobility charge 1 Mobility   Nurse requested Mobility Specialist to perform oxygen saturation test with pt which includes removing pt from oxygen both at rest and while ambulating.  Below are the results from that testing.     Patient Saturations on Room Air at Rest = spO2 93%  Patient Saturations on Room Air while Ambulating = sp02 89% .  Rested and performed pursed lip breathing for 1 minute with sp02 at 91%.  At end of testing pt left in room on 2 Liters of oxygen.  Reported results to nurse.   Pt received in chair an agreed to mobility with no c/o pain nor discomfort. Pt returned to chair with all needs met.   Roderick Pee Mobility Specialist

## 2022-03-19 DIAGNOSIS — J189 Pneumonia, unspecified organism: Secondary | ICD-10-CM | POA: Diagnosis not present

## 2022-03-19 DIAGNOSIS — R7401 Elevation of levels of liver transaminase levels: Secondary | ICD-10-CM | POA: Diagnosis not present

## 2022-03-19 DIAGNOSIS — C61 Malignant neoplasm of prostate: Secondary | ICD-10-CM | POA: Diagnosis not present

## 2022-03-19 LAB — COMPREHENSIVE METABOLIC PANEL
ALT: 119 U/L — ABNORMAL HIGH (ref 0–44)
AST: 48 U/L — ABNORMAL HIGH (ref 15–41)
Albumin: 2.9 g/dL — ABNORMAL LOW (ref 3.5–5.0)
Alkaline Phosphatase: 56 U/L (ref 38–126)
Anion gap: 8 (ref 5–15)
BUN: 17 mg/dL (ref 8–23)
CO2: 23 mmol/L (ref 22–32)
Calcium: 8.6 mg/dL — ABNORMAL LOW (ref 8.9–10.3)
Chloride: 107 mmol/L (ref 98–111)
Creatinine, Ser: 0.87 mg/dL (ref 0.61–1.24)
GFR, Estimated: >60 mL/min (ref >60)
Glucose, Bld: 101 mg/dL — ABNORMAL HIGH (ref 70–99)
Potassium: 4.7 mmol/L (ref 3.5–5.1)
Sodium: 138 mmol/L (ref 135–145)
Total Bilirubin: 0.6 mg/dL (ref 0.3–1.2)
Total Protein: 6.9 g/dL (ref 6.5–8.1)

## 2022-03-19 LAB — CULTURE, RESPIRATORY W GRAM STAIN: Culture: NORMAL

## 2022-03-19 LAB — CBC
HCT: 44.9 % (ref 39.0–52.0)
Hemoglobin: 14.7 g/dL (ref 13.0–17.0)
MCH: 31.5 pg (ref 26.0–34.0)
MCHC: 32.7 g/dL (ref 30.0–36.0)
MCV: 96.1 fL (ref 80.0–100.0)
Platelets: 533 10*3/uL — ABNORMAL HIGH (ref 150–400)
RBC: 4.67 MIL/uL (ref 4.22–5.81)
RDW: 12.9 % (ref 11.5–15.5)
WBC: 16.5 10*3/uL — ABNORMAL HIGH (ref 4.0–10.5)
nRBC: 0 % (ref 0.0–0.2)

## 2022-03-19 LAB — HSV 1/2 PCR (SURFACE)
HSV-1 DNA: DETECTED — AB
HSV-2 DNA: NOT DETECTED

## 2022-03-19 LAB — PROCALCITONIN: Procalcitonin: 1.35 ng/mL

## 2022-03-19 NOTE — Progress Notes (Signed)
  Progress Note   Patient: Maurice [name] MPN:[id number] DOB: 10-Oct-1950 DOA: 03/15/2022     3 DOS: the patient was seen and examined on 03/19/2022 at 10:14AM      Brief hospital course: Mr. Boehlke is a 72 y.o. M with pros CA and recent admission for RSV who presented with malaise, SOB.        Assessment and Plan: * CAP (community acquired pneumonia) White blood cell count and procalcitonin both trending up.  Weaned off of oxygen, but still with sweats, malaise.  - Follow sputum culture, still pending - Continue cefepime day 3 and azithromycin day 5 of 5   Transaminitis Mild, unchanged  Hypoxia Respiratory failure ruled out.  Cold sores - Continue Valtrex - Follow HSV surface PCR            Subjective: Patient still has night sweats, sleep is better with trazodone, he has no confusion, respiratory distress.  Still coughing.     Physical Exam: BP 123/83 (BP Location: Right Arm)   Pulse 65   Temp 97.8 F (36.6 C) (Oral)   Resp 17   SpO2 93%   adult male, sitting up in bed, interactive and appropriate RRR, no murmurs, no peripheral edema Respiratory rate normal, lungs clear without rales or wheezes Abdomen soft without tenderness palpation or guarding Attention normal, affect normal, judgment insight appear normal    Data Reviewed: CMP, no change, LFTs and 48-1 19 range Platelets are currently 60 Procalcitonin up to 1.35 HSV pending    Family Communication: None present    Disposition: Status is: Inpatient GIven prior failure of outpatient management, will await fculture and continue IV antibiotics.        Author: Edwin Dada, MD 03/19/2022 12:03 PM  For on call review [email or web address].

## 2022-03-20 DIAGNOSIS — J189 Pneumonia, unspecified organism: Secondary | ICD-10-CM | POA: Diagnosis not present

## 2022-03-20 LAB — CULTURE, BLOOD (ROUTINE X 2)
Culture: NO GROWTH
Culture: NO GROWTH
Special Requests: ADEQUATE

## 2022-03-20 LAB — CBC
HCT: 40.5 % (ref 39.0–52.0)
Hemoglobin: 13.2 g/dL (ref 13.0–17.0)
MCH: 31.6 pg (ref 26.0–34.0)
MCHC: 32.6 g/dL (ref 30.0–36.0)
MCV: 96.9 fL (ref 80.0–100.0)
Platelets: 498 10*3/uL — ABNORMAL HIGH (ref 150–400)
RBC: 4.18 MIL/uL — ABNORMAL LOW (ref 4.22–5.81)
RDW: 12.8 % (ref 11.5–15.5)
WBC: 13.6 10*3/uL — ABNORMAL HIGH (ref 4.0–10.5)
nRBC: 0 % (ref 0.0–0.2)

## 2022-03-20 LAB — COMPREHENSIVE METABOLIC PANEL
ALT: 101 U/L — ABNORMAL HIGH (ref 0–44)
AST: 37 U/L (ref 15–41)
Albumin: 2.6 g/dL — ABNORMAL LOW (ref 3.5–5.0)
Alkaline Phosphatase: 50 U/L (ref 38–126)
Anion gap: 8 (ref 5–15)
BUN: 16 mg/dL (ref 8–23)
CO2: 23 mmol/L (ref 22–32)
Calcium: 8.3 mg/dL — ABNORMAL LOW (ref 8.9–10.3)
Chloride: 104 mmol/L (ref 98–111)
Creatinine, Ser: 0.75 mg/dL (ref 0.61–1.24)
GFR, Estimated: >60 mL/min (ref >60)
Glucose, Bld: 98 mg/dL (ref 70–99)
Potassium: 4.2 mmol/L (ref 3.5–5.1)
Sodium: 135 mmol/L (ref 135–145)
Total Bilirubin: 0.5 mg/dL (ref 0.3–1.2)
Total Protein: 6 g/dL — ABNORMAL LOW (ref 6.5–8.1)

## 2022-03-20 LAB — PROCALCITONIN: Procalcitonin: 1.23 ng/mL

## 2022-03-20 MED ORDER — VALACYCLOVIR HCL 1 G PO TABS: 2000.0000 mg | ORAL_TABLET | Freq: Two times a day (BID) | ORAL | 0 refills | Status: AC

## 2022-03-20 MED ORDER — GUAIFENESIN ER 600 MG PO TB12: 600.0000 mg | ORAL_TABLET | Freq: Two times a day (BID) | ORAL | 0 refills | Status: AC

## 2022-03-20 MED ORDER — LEVOFLOXACIN 750 MG PO TABS: 750.0000 mg | ORAL_TABLET | Freq: Every day | ORAL | 0 refills | Status: AC

## 2022-03-20 MED ORDER — TRAZODONE HCL 50 MG PO TABS: 50.0000 mg | ORAL_TABLET | Freq: Every evening | ORAL | 1 refills | Status: DC | PRN

## 2022-03-20 MED ORDER — SALINE SPRAY 0.65 % NA SOLN: 1.0000 | NASAL | Status: DC | PRN

## 2022-03-20 NOTE — Progress Notes (Signed)
Patient discharged to home with family, discharge instructions reviewed with patient who verbalized understanding. 

## 2022-03-20 NOTE — Discharge Summary (Signed)
Physician Discharge Summary   Patient: Maurice [name] MRN: [id number] DOB: 17-May-1950  Admit date:     03/15/2022  Discharge date: 03/20/22  Discharge Physician: Edwin Dada   PCP: [name], MD     Recommendations at discharge:  Follow up with PCP Dr. Ardeth Perfect in 1 week Dr. Ardeth Perfect: Please repeat CXR or CT chest in 4 weeks to document resolution and rule out underlying lung disease Repeat LFTs to document resolution     Discharge Diagnoses: Principal Problem:   CAP (community acquired pneumonia) Active Problems:   RSV (respiratory syncytial virus pneumonia)   Transaminitis   HSV1 reactivation ulcer     Hospital Course: Maurice [name] is a 72 y.o. M with pros CA and recent admission for RSV who presented with malaise, SOB.      * CAP (community acquired pneumonia) Patient admitted with RSV and mulifocal pneumonia 1 week ago, treated with Rocephin, azithromycin and steroids. Procalcitonin 3.2 ng/mL during that hospitalization.  Went home finished 5 days Augmentin/azithro, felt gradually worse, returned with WBC 25K, requiring O2 again, CXR with bilateral opacities still.  I suspect just a treatment failure, given HR still 109 on day of discharge, temp 101F within 24 hours of d/c.  Procal here 0.4 > 0.7, sputum culture gram stain positive  Treated here with Cefepime and doxycycline.  WBC improved to 13K on day of discharge.  Patient now mentating at baseline, taking orals.  Temp < 100 F, heart rate < 100bpm, RR < 24, SpO2 at baseline.    Sputum culture with normal respiratory flora, discharged with few more days Levaquin to complete course.       Transaminitis Mild  Hypoxia Respiratory failure ruled out.               The [facility] Controlled Substances Registry was reviewed for this patient prior to discharge.  Consultants: None   Disposition: Home Diet recommendation:  Regular diet  DISCHARGE MEDICATION: Allergies as of 03/20/2022    No Known Allergies      Medication List     STOP taking these medications    amoxicillin-clavulanate 875-125 MG tablet Commonly known as: AUGMENTIN   azithromycin 250 MG tablet Commonly known as: Zithromax       TAKE these medications    guaiFENesin 600 MG 12 hr tablet Commonly known as: MUCINEX Take 1 tablet (600 mg total) by mouth 2 (two) times daily for 5 days.   ibuprofen 200 MG tablet Commonly known as: ADVIL Take 400 mg by mouth daily as needed for mild pain.   levofloxacin 750 MG tablet Commonly known as: Levaquin Take 1 tablet (750 mg total) by mouth daily for 4 days.   oxymetazoline 0.05 % nasal spray Commonly known as: AFRIN Place 1-5 sprays into both nostrils at bedtime as needed for congestion.   sildenafil 100 MG tablet Commonly known as: VIAGRA Take 100 mg by mouth daily as needed for erectile dysfunction.   traZODone 50 MG tablet Commonly known as: DESYREL Take 1 tablet (50 mg total) by mouth at bedtime as needed for sleep.   TUMS PO Take 3 tablets by mouth as needed (reflux).   valACYclovir 1000 MG tablet Commonly known as: VALTREX Take 2 tablets (2,000 mg total) by mouth 2 (two) times daily for 1 day.        Follow-up Information     [name], MD. Schedule an appointment as soon as possible for a visit in 1 week(s).   Specialty: Internal  Medicine Contact information: [address] [place] [address] [phone]                 Discharge Instructions     Discharge instructions   Complete by: As directed    **IMPORTANT DISCHARGE INSTRUCTIONS**   From Dr. Loleta Books: You were admitted for what I suspect was a failed treatment of bacterial pneumonia superimposed on your RSV  You were treated here with cefepime  Your sputum culture did not grow anything, thankfully, and you improved with antibiotics  You should finish the antibiotics by taking levofloxacin/Levaquin 750 mg once daily starting this evening  for 4 more days  Use the flutter and incentive spirometer  Go see Dr. Ardeth Perfect in 1 week  Return for dizziness/passing out, severe dyspnea that is getting worse, fever over 100.23F that won't go away, low oxygen levels  For the oral ulcers, take two more doses on Valtrex (once tonight and once tomororw morning) Take valacyclovir/Valtrex 2000 mg (two tabs) twice daily for 1 day  Ask Dr. Ardeth Perfect to do repeat chest imaging in 3-4 weeks   Increase activity slowly   Complete by: As directed        Discharge Exam: Filed Weights   03/19/22 2100  Weight: 82.2 kg    General: Pt is alert, awake, not in acute distress Cardiovascular: RRR, nl S1-S2, no murmurs appreciated.   No LE edema.   Respiratory: Normal respiratory rate and rhythm.  CTAB without rales or wheezes. Abdominal: Abdomen soft and non-tender.  No distension or HSM.   Neuro/Psych: Strength symmetric in upper and lower extremities.  Judgment and insight appear normal.   Condition at discharge: good  The results of significant diagnostics from this hospitalization (including imaging, microbiology, ancillary and laboratory) are listed below for reference.   Imaging Studies: CT Angio Chest PE W/Cm &/Or Wo Cm  Result Date: 03/15/2022 CLINICAL DATA:  High probability for PE.  Sepsis. EXAM: CT ANGIOGRAPHY CHEST WITH CONTRAST TECHNIQUE: Multidetector CT imaging of the chest was performed using the standard protocol during bolus administration of intravenous contrast. Multiplanar CT image reconstructions and MIPs were obtained to evaluate the vascular anatomy. RADIATION DOSE REDUCTION: This exam was performed according to the departmental dose-optimization program which includes automated exposure control, adjustment of the mA and/or kV according to patient size and/or use of iterative reconstruction technique. CONTRAST:  163m OMNIPAQUE IOHEXOL 350 MG/ML SOLN COMPARISON:  CT of the chest 03/10/2022 FINDINGS: Cardiovascular:  Satisfactory opacification of the pulmonary arteries to the segmental level. No evidence of pulmonary embolism. Normal heart size. No pericardial effusion. Mediastinum/Nodes: No enlarged mediastinal, hilar, or axillary lymph nodes. Thyroid gland, trachea, and esophagus demonstrate no significant findings. Lungs/Pleura: There are multifocal patchy airspace and ground-glass opacities in the bilateral lower lobes and minimally in the bilateral upper lobes, right greater than left. Is no pleural effusion or pneumothorax. Trachea and central airways are patent. Upper Abdomen: No acute abnormality. Musculoskeletal: No chest wall abnormality. No acute or significant osseous findings. Review of the MIP images confirms the above findings. IMPRESSION: 1. No evidence for pulmonary embolism. 2. Multifocal patchy airspace and ground-glass opacities in the bilateral lower lobes and minimally in the bilateral upper lobes, right greater than left. Findings are likely infectious/inflammatory. Electronically Signed   By: ARonney AstersM.D.   On: 03/15/2022 19:38   DG Chest Port 1 View  Result Date: 03/15/2022 CLINICAL DATA:  Fever. EXAM: PORTABLE CHEST 1 VIEW COMPARISON:  March 10, 2022. FINDINGS: The heart size  and mediastinal contours are within normal limits. Both lungs are clear. Degenerative changes are seen involving the glenohumeral joints bilaterally. IMPRESSION: No active disease. Electronically Signed   By: Marijo Conception M.D.   On: 03/15/2022 18:15   CT Chest Wo Contrast  Result Date: 03/10/2022 CLINICAL DATA:  Respiratory illness.  Cough for 6 days. EXAM: CT CHEST WITHOUT CONTRAST TECHNIQUE: Multidetector CT imaging of the chest was performed following the standard protocol without IV contrast. RADIATION DOSE REDUCTION: This exam was performed according to the departmental dose-optimization program which includes automated exposure control, adjustment of the mA and/or kV according to patient size and/or use of  iterative reconstruction technique. COMPARISON:  Chest radiograph 03/10/2022 FINDINGS: Cardiovascular: Normal heart size. Lad coronary artery calcifications. Trace anterior pericardial fluid. Mediastinum/Nodes: No enlarged mediastinal or axillary lymph nodes. Thyroid gland, trachea, and esophagus demonstrate no significant findings. Lungs/Pleura: No pleural fluid. There is diffuse bronchial wall thickening noted. Central airways appear patent. There is diffuse bronchial wall thickening. Bilateral lower lobe airspace disease is noted, right greater than left. Subsegmental atelectasis noted in the right middle lobe. No suspicious pulmonary nodule or mass identified at this time. Upper Abdomen: No acute abnormality. Musculoskeletal: No chest wall mass or suspicious bone lesions identified. Multilevel thoracic degenerative disc disease. IMPRESSION: 1. Bilateral lower lobe airspace disease, right greater than left, compatible with pneumonia. Follow-up imaging advised to ensure complete resolution and to rule out underlying malignancy. 2. Diffuse bronchial wall thickening compatible with bronchitis. 3. Right middle lobe subsegmental atelectasis. 4. Coronary artery calcifications. Electronically Signed   By: Kerby Moors M.D.   On: 03/10/2022 18:10   DG Chest 2 View  Result Date: 03/10/2022 CLINICAL DATA:  Worsening cough.  RSV 8 days ago. EXAM: CHEST - 2 VIEW COMPARISON:  None Available. FINDINGS: No pneumothorax or effusion. Normal cardiopericardial silhouette without view. Focal consolidation along the left lower lobe posteromedial. Possible infiltrate. Recommend follow-up to confirm clearance. Degenerative changes along the spine IMPRESSION: Focal opacity along the inferior medial left lower lobe. Possible infiltrate. Recommend follow-up Electronically Signed   By: Jill Side M.D.   On: 03/10/2022 12:29   DG Chest Port 1 View  Result Date: 03/08/2022 CLINICAL DATA:  Possible sepsis.  Fever for 3 days. EXAM:  PORTABLE CHEST 1 VIEW COMPARISON:  04/02/2021. FINDINGS: The heart size and mediastinal contours are within normal limits. No consolidation, effusion, or pneumothorax. No acute osseous abnormality. IMPRESSION: No active disease. Electronically Signed   By: Brett Fairy M.D.   On: 03/08/2022 23:11    Microbiology: Results for orders placed or performed during the hospital encounter of 03/15/22  Culture, blood (routine x 2)     Status: None   Collection Time: 03/15/22  6:34 PM   Specimen: BLOOD  Result Value Ref Range Status   Specimen Description   Final    BLOOD RIGHT ANTECUBITAL Performed at [facility], [address] [place], [place] [address]    Special Requests   Final    BOTTLES DRAWN AEROBIC AND ANAEROBIC Blood Culture results may not be optimal due to an inadequate volume of blood received in culture bottles Performed at [facility], [address] [place], [place] [address]    Culture   Final    NO GROWTH 5 DAYS Performed at [facility], [place] [address], [place], [place] [address]    Report Status 03/20/2022 FINAL  Final  Culture, blood (routine x 2)     Status: None  Collection Time: 03/15/22  6:44 PM   Specimen: BLOOD  Result Value Ref Range Status   Specimen Description   Final    BLOOD LEFT ANTECUBITAL Performed at [facility], [address], [place], [place] [address]    Special Requests   Final    BOTTLES DRAWN AEROBIC AND ANAEROBIC Blood Culture adequate volume Performed at [facility], [address], [place], [place] [address]    Culture   Final    NO GROWTH 5 DAYS Performed at [facility], [place] [address] [place], [place] [address]    Report Status 03/20/2022 FINAL  Final  Resp panel by RT-PCR (RSV, Flu A&B, Covid) Anterior Nasal Swab     Status: None   Collection Time: 03/15/22  8:57 PM   Specimen: Anterior Nasal Swab  Result Value Ref Range Status   SARS  Coronavirus 2 by RT PCR NEGATIVE NEGATIVE Final    Comment: (NOTE) SARS-CoV-2 target nucleic acids are NOT DETECTED.  The SARS-CoV-2 RNA is generally detectable in upper respiratory specimens during the acute phase of infection. The lowest concentration of SARS-CoV-2 viral copies this assay can detect is 138 copies/mL. A negative result does not preclude SARS-Cov-2 infection and should not be used as the sole basis for treatment or other patient management decisions. A negative result may occur with  improper specimen collection/handling, submission of specimen other than nasopharyngeal swab, presence of viral mutation(s) within the areas targeted by this assay, and inadequate number of viral copies(<138 copies/mL). A negative result must be combined with clinical observations, patient history, and epidemiological information. The expected result is Negative.  Fact Sheet for Patients:  EntrepreneurPulse.com.au  Fact Sheet for Healthcare Providers:  IncredibleEmployment.be  This test is no t yet approved or cleared by the [place] FDA and  has been authorized for detection and/or diagnosis of SARS-CoV-2 by FDA under an Emergency Use Authorization (EUA). This EUA will remain  in effect (meaning this test can be used) for the duration of the COVID-19 declaration under Section 564(b)(1) of the Act, 21 U.S.C.section 360bbb-3(b)(1), unless the authorization is terminated  or revoked sooner.       Influenza A by PCR NEGATIVE NEGATIVE Final   Influenza B by PCR NEGATIVE NEGATIVE Final    Comment: (NOTE) The Xpert Xpress SARS-CoV-2/FLU/RSV plus assay is intended as an aid in the diagnosis of influenza from Nasopharyngeal swab specimens and should not be used as a sole basis for treatment. Nasal washings and aspirates are unacceptable for Xpert Xpress SARS-CoV-2/FLU/RSV testing.  Fact Sheet for  Patients: EntrepreneurPulse.com.au  Fact Sheet for Healthcare Providers: IncredibleEmployment.be  This test is not yet approved or cleared by the [place] FDA and has been authorized for detection and/or diagnosis of SARS-CoV-2 by FDA under an Emergency Use Authorization (EUA). This EUA will remain in effect (meaning this test can be used) for the duration of the COVID-19 declaration under Section 564(b)(1) of the Act, 21 U.S.C. section 360bbb-3(b)(1), unless the authorization is terminated or revoked.     Resp Syncytial Virus by PCR NEGATIVE NEGATIVE Final    Comment: (NOTE) Fact Sheet for Patients: EntrepreneurPulse.com.au  Fact Sheet for Healthcare Providers: IncredibleEmployment.be  This test is not yet approved or cleared by the [place] FDA and has been authorized for detection and/or diagnosis of SARS-CoV-2 by FDA under an Emergency Use Authorization (EUA). This EUA will remain in effect (meaning this test can be used) for the duration of the COVID-19 declaration under Section  564(b)(1) of the Act, 21 U.S.C. section 360bbb-3(b)(1), unless the authorization is terminated or revoked.  Performed at [facility], [address] [place], [place] [address]   MRSA Next Gen by PCR, Nasal     Status: None   Collection Time: 03/16/22  3:36 AM   Specimen: Nasal Mucosa; Nasal Swab  Result Value Ref Range Status   MRSA by PCR Next Gen NOT DETECTED NOT DETECTED Final    Comment: (NOTE) The GeneXpert MRSA Assay (FDA approved for NASAL specimens only), is one component of a comprehensive MRSA colonization surveillance program. It is not intended to diagnose MRSA infection nor to guide or monitor treatment for MRSA infections. Test performance is not FDA approved in patients less than 57 years old. Performed at [facility], [place] [address] [place], [place]  [address]   Expectorated Sputum Assessment w Gram Stain, Rflx to Resp Cult     Status: None   Collection Time: 03/16/22  7:28 AM   Specimen: Sputum  Result Value Ref Range Status   Specimen Description SPUTUM  Final   Special Requests NONE  Final   Sputum evaluation   Final    THIS SPECIMEN IS ACCEPTABLE FOR SPUTUM CULTURE Performed at [facility], [place] [address] [place], [place] [address]    Report Status 03/16/2022 FINAL  Final  Culture, Respiratory w Gram Stain     Status: None   Collection Time: 03/16/22  7:28 AM   Specimen: SPU  Result Value Ref Range Status   Specimen Description   Final    SPUTUM Performed at [place] [address], [place], [place] [address]    Special Requests   Final    NONE Reflexed from [id number] Performed at [facility], [place] [address], [place], [place] [address]    Gram Stain   Final    ABUNDANT SQUAMOUS EPITHELIAL CELLS PRESENT ABUNDANT WBC PRESENT, PREDOMINANTLY PMN MODERATE GRAM POSITIVE COCCI IN PAIRS RARE YEAST WITH PSEUDOHYPHAE FEW GRAM NEGATIVE RODS    Culture   Final    FEW Normal respiratory flora-no Staph aureus or Pseudomonas seen Performed at [facility], [address], [place], [place] [address]    Report Status 03/19/2022 FINAL  Final    Labs: CBC: Recent Labs  Lab 03/15/22 1733 03/16/22 0308 03/17/22 0815 03/18/22 0728 03/19/22 0553 03/20/22 0803  WBC 25.1* 24.7* 15.7* 14.0* 16.5* 13.6*  NEUTROABS 20.1*  --   --   --   --   --   HGB 14.0 12.3* 13.4 13.2 14.7 13.2  HCT 40.9 35.7* 40.7 40.3 44.9 40.5  MCV 93.0 93.2 95.5 94.8 96.1 96.9  PLT 441* 427* 476* 474* 533* 629*   Basic Metabolic Panel: Recent Labs  Lab 03/16/22 0308 03/17/22 0815 03/18/22 0728 03/19/22 0553 03/20/22 0803  NA 134* 136 133* 138 135  K 3.9 4.2 3.9 4.7 4.2  CL 106 106 103 107 104  CO2 20* '22 22 23 23  '$ GLUCOSE 166* 101* 95 101* 98  BUN '17 17 16 17 16  '$ CREATININE 0.74  0.84 0.79 0.87 0.75  CALCIUM 7.9* 8.3* 8.1* 8.6* 8.3*   Liver Function Tests: Recent Labs  Lab 03/15/22 1733 03/16/22 0308 03/17/22 0815 03/19/22 0553 03/20/22 0803  AST 53* 36 57* 48* 37  ALT 111* 95* 129* 119* 101*  ALKPHOS 65 63 59 56 50  BILITOT 0.5 0.4 0.5 0.6 0.5  PROT 6.8 6.4* 6.2* 6.9 6.0*  ALBUMIN 3.5 2.6* 2.6* 2.9* 2.6*   CBG: No results for input(s): "GLUCAP" in the last 168 hours.  Discharge time spent: approximately 35 minutes spent on discharge counseling, evaluation of patient on day of discharge, and coordination of discharge planning with nursing, social work, pharmacy and case management  Signed: Edwin Dada, MD [facility] 03/20/2022

## 2022-04-09 ENCOUNTER — Ambulatory Visit (HOSPITAL_BASED_OUTPATIENT_CLINIC_OR_DEPARTMENT_OTHER)
Admission: RE | Admit: 2022-04-09 | Payer: Federal, State, Local not specified - PPO | Source: Home / Self Care | Admitting: Urology

## 2022-04-09 SURGERY — INSERTION, RADIATION SOURCE, PROSTATE
Anesthesia: Choice

## 2022-05-12 HISTORY — PX: CATARACT EXTRACTION: SUR2

## 2022-06-01 ENCOUNTER — Other Ambulatory Visit: Payer: Self-pay

## 2022-06-01 ENCOUNTER — Encounter (HOSPITAL_BASED_OUTPATIENT_CLINIC_OR_DEPARTMENT_OTHER): Payer: Self-pay | Admitting: Urology

## 2022-06-01 NOTE — Progress Notes (Addendum)
Spoke w/ via phone for pre-op interview: patient Lab needs dos: NA Lab results: EKG 03/11/2022 COVID test: patient states asymptomatic no test needed. Arrive at 0800 06/03/22. NPO after MN except clear liquids.Clear liquids from MN until 0700. Med rec completed. Medications to take morning of surgery: Afrin spray Diabetic medication: NA Patient instructed to bring photo id and insurance card day of surgery. Patient aware to have Driver (ride ) / caregiver for 24 hours after surgery. (Wife to drive) Patient Special Instructions: NA Pre-Op special Istructions: NA Patient verbalized understanding of instructions that were given at this phone interview. Patient denies shortness of breath, chest pain, fever, cough at this phone interview. Patient states been clear of pneumonia symptoms since 05/07/22.

## 2022-06-02 ENCOUNTER — Other Ambulatory Visit: Payer: Self-pay | Admitting: Urology

## 2022-06-02 ENCOUNTER — Telehealth: Payer: Self-pay | Admitting: *Deleted

## 2022-06-02 NOTE — Telephone Encounter (Signed)
CALLED PATIENT TO REMIND OF PROCEDURE FOR 06-03-22, SPOKE WITH PATIENT AND HE IS AWARE OF THIS PROCEDURE

## 2022-06-03 ENCOUNTER — Ambulatory Visit (HOSPITAL_BASED_OUTPATIENT_CLINIC_OR_DEPARTMENT_OTHER): Payer: Federal, State, Local not specified - PPO | Admitting: Anesthesiology

## 2022-06-03 ENCOUNTER — Encounter (HOSPITAL_BASED_OUTPATIENT_CLINIC_OR_DEPARTMENT_OTHER)
Admission: RE | Disposition: A | Discharge status: Home / Self Care | Payer: Self-pay | Source: Home / Self Care | Attending: Urology

## 2022-06-03 ENCOUNTER — Ambulatory Visit (HOSPITAL_COMMUNITY): Payer: Federal, State, Local not specified - PPO

## 2022-06-03 ENCOUNTER — Encounter (HOSPITAL_BASED_OUTPATIENT_CLINIC_OR_DEPARTMENT_OTHER): Payer: Self-pay | Admitting: Urology

## 2022-06-03 ENCOUNTER — Ambulatory Visit (HOSPITAL_BASED_OUTPATIENT_CLINIC_OR_DEPARTMENT_OTHER)
Admission: RE | Admit: 2022-06-03 | Discharge: 2022-06-03 | Disposition: A | Discharge status: Home / Self Care | Payer: Federal, State, Local not specified - PPO | Attending: Urology | Admitting: Urology

## 2022-06-03 ENCOUNTER — Other Ambulatory Visit: Payer: Self-pay

## 2022-06-03 DIAGNOSIS — R310 Gross hematuria: Secondary | ICD-10-CM | POA: Diagnosis not present

## 2022-06-03 DIAGNOSIS — C61 Malignant neoplasm of prostate: Secondary | ICD-10-CM | POA: Insufficient documentation

## 2022-06-03 DIAGNOSIS — E785 Hyperlipidemia, unspecified: Secondary | ICD-10-CM | POA: Diagnosis not present

## 2022-06-03 DIAGNOSIS — Z01818 Encounter for other preprocedural examination: Secondary | ICD-10-CM

## 2022-06-03 HISTORY — PX: SPACE OAR INSTILLATION: SHX6769

## 2022-06-03 HISTORY — PX: RADIOACTIVE SEED IMPLANT: SHX5150

## 2022-06-03 SURGERY — INSERTION, RADIATION SOURCE, PROSTATE
Anesthesia: General | Site: Prostate

## 2022-06-03 MED ORDER — ROCURONIUM BROMIDE 10 MG/ML (PF) SYRINGE
PREFILLED_SYRINGE | INTRAVENOUS | Status: AC
  Filled 2022-06-03: qty 10

## 2022-06-03 MED ORDER — MIDAZOLAM HCL 5 MG/5ML IJ SOLN
INTRAMUSCULAR | Status: DC | PRN
  Administered 2022-06-03: 2 mg via INTRAVENOUS

## 2022-06-03 MED ORDER — SODIUM CHLORIDE 0.9 % IR SOLN
Status: DC | PRN
  Administered 2022-06-03: 1000 mL via INTRAVESICAL

## 2022-06-03 MED ORDER — FENTANYL CITRATE (PF) 100 MCG/2ML IJ SOLN
INTRAMUSCULAR | Status: DC | PRN
  Administered 2022-06-03 (×2): 50 ug via INTRAVENOUS

## 2022-06-03 MED ORDER — FENTANYL CITRATE (PF) 100 MCG/2ML IJ SOLN
INTRAMUSCULAR | Status: AC
  Filled 2022-06-03: qty 2

## 2022-06-03 MED ORDER — ACETAMINOPHEN 500 MG PO TABS
ORAL_TABLET | ORAL | Status: AC
  Filled 2022-06-03: qty 2

## 2022-06-03 MED ORDER — DEXAMETHASONE SODIUM PHOSPHATE 10 MG/ML IJ SOLN
INTRAMUSCULAR | Status: DC | PRN
  Administered 2022-06-03: 5 mg via INTRAVENOUS

## 2022-06-03 MED ORDER — ONDANSETRON HCL 4 MG/2ML IJ SOLN
INTRAMUSCULAR | Status: AC
  Filled 2022-06-03: qty 2

## 2022-06-03 MED ORDER — IOHEXOL 300 MG/ML  SOLN
INTRAMUSCULAR | Status: DC | PRN
  Administered 2022-06-03: 3 mL

## 2022-06-03 MED ORDER — CIPROFLOXACIN IN D5W 400 MG/200ML IV SOLN
INTRAVENOUS | Status: AC
  Filled 2022-06-03: qty 200

## 2022-06-03 MED ORDER — LIDOCAINE 2% (20 MG/ML) 5 ML SYRINGE
INTRAMUSCULAR | Status: DC | PRN
  Administered 2022-06-03: 60 mg via INTRAVENOUS

## 2022-06-03 MED ORDER — SUGAMMADEX SODIUM 200 MG/2ML IV SOLN
INTRAVENOUS | Status: DC | PRN
  Administered 2022-06-03: 200 mg via INTRAVENOUS

## 2022-06-03 MED ORDER — PROPOFOL 10 MG/ML IV BOLUS
INTRAVENOUS | Status: DC | PRN
  Administered 2022-06-03: 150 mg via INTRAVENOUS

## 2022-06-03 MED ORDER — LACTATED RINGERS IV SOLN: INTRAVENOUS | Status: DC

## 2022-06-03 MED ORDER — KETOROLAC TROMETHAMINE 30 MG/ML IJ SOLN
INTRAMUSCULAR | Status: DC | PRN
  Administered 2022-06-03: 15 mg via INTRAVENOUS

## 2022-06-03 MED ORDER — EPHEDRINE 5 MG/ML INJ
INTRAVENOUS | Status: AC
  Filled 2022-06-03: qty 5

## 2022-06-03 MED ORDER — PROPOFOL 10 MG/ML IV BOLUS
INTRAVENOUS | Status: AC
  Filled 2022-06-03: qty 20

## 2022-06-03 MED ORDER — STERILE WATER FOR IRRIGATION IR SOLN
Status: DC | PRN
  Administered 2022-06-03: 500 mL

## 2022-06-03 MED ORDER — ONDANSETRON HCL 4 MG/2ML IJ SOLN
INTRAMUSCULAR | Status: DC | PRN
  Administered 2022-06-03: 4 mg via INTRAVENOUS

## 2022-06-03 MED ORDER — ROCURONIUM BROMIDE 10 MG/ML (PF) SYRINGE
PREFILLED_SYRINGE | INTRAVENOUS | Status: DC | PRN
  Administered 2022-06-03: 50 mg via INTRAVENOUS
  Administered 2022-06-03: 10 mg via INTRAVENOUS

## 2022-06-03 MED ORDER — TAMSULOSIN HCL 0.4 MG PO CAPS: 0.4000 mg | ORAL_CAPSULE | Freq: Every day | ORAL | 11 refills | Status: DC | PRN

## 2022-06-03 MED ORDER — SODIUM CHLORIDE (PF) 0.9 % IJ SOLN
INTRAMUSCULAR | Status: DC | PRN
  Administered 2022-06-03: 10 mL

## 2022-06-03 MED ORDER — DEXAMETHASONE SODIUM PHOSPHATE 10 MG/ML IJ SOLN
INTRAMUSCULAR | Status: AC
  Filled 2022-06-03: qty 1

## 2022-06-03 MED ORDER — ACETAMINOPHEN 500 MG PO TABS
1000.0000 mg | ORAL_TABLET | Freq: Once | ORAL | Status: AC
  Administered 2022-06-03: 1000 mg via ORAL

## 2022-06-03 MED ORDER — FENTANYL CITRATE (PF) 100 MCG/2ML IJ SOLN: 25.0000 ug | INTRAMUSCULAR | Status: DC | PRN

## 2022-06-03 MED ORDER — SENNOSIDES-DOCUSATE SODIUM 8.6-50 MG PO TABS: 1.0000 | ORAL_TABLET | Freq: Two times a day (BID) | ORAL | 0 refills | Status: DC

## 2022-06-03 MED ORDER — TRAMADOL HCL 50 MG PO TABS: 50.0000 mg | ORAL_TABLET | Freq: Four times a day (QID) | ORAL | 0 refills | Status: DC | PRN

## 2022-06-03 MED ORDER — MIDAZOLAM HCL 2 MG/2ML IJ SOLN
INTRAMUSCULAR | Status: AC
  Filled 2022-06-03: qty 2

## 2022-06-03 MED ORDER — EPHEDRINE SULFATE-NACL 50-0.9 MG/10ML-% IV SOSY
PREFILLED_SYRINGE | INTRAVENOUS | Status: DC | PRN
  Administered 2022-06-03: 10 mg via INTRAVENOUS

## 2022-06-03 MED ORDER — CIPROFLOXACIN IN D5W 400 MG/200ML IV SOLN
INTRAVENOUS | Status: DC | PRN
  Administered 2022-06-03: 400 mg via INTRAVENOUS

## 2022-06-03 SURGICAL SUPPLY — 46 items
BAG DRN RND TRDRP ANRFLXCHMBR (UROLOGICAL SUPPLIES) ×1
BAG URINE DRAIN 2000ML AR STRL (UROLOGICAL SUPPLIES) ×1 IMPLANT
BLADE CLIPPER SENSICLIP SURGIC (BLADE) ×1 IMPLANT
CATH FOLEY 2WAY SLVR  5CC 16FR (CATHETERS) ×1
CATH FOLEY 2WAY SLVR 5CC 16FR (CATHETERS) ×1 IMPLANT
CATH ROBINSON RED A/P 16FR (CATHETERS) IMPLANT
CATH ROBINSON RED A/P 20FR (CATHETERS) ×1 IMPLANT
CLOTH BEACON ORANGE TIMEOUT ST (SAFETY) ×1 IMPLANT
CNTNR URN SCR LID CUP LEK RST (MISCELLANEOUS) ×1 IMPLANT
CONT SPEC 4OZ STRL OR WHT (MISCELLANEOUS) ×1
COVER BACK TABLE 60X90IN (DRAPES) ×1 IMPLANT
COVER MAYO STAND STRL (DRAPES) ×1 IMPLANT
DRSG TEGADERM 4X4.75 (GAUZE/BANDAGES/DRESSINGS) ×1 IMPLANT
DRSG TEGADERM 8X12 (GAUZE/BANDAGES/DRESSINGS) ×1 IMPLANT
GAUZE SPONGE 4X4 12PLY STRL LF (GAUZE/BANDAGES/DRESSINGS) IMPLANT
GEL ULTRASOUND 20GR AQUASONIC (MISCELLANEOUS) ×2 IMPLANT
GLOVE BIO SURGEON STRL SZ 6.5 (GLOVE) IMPLANT
GLOVE BIO SURGEON STRL SZ7.5 (GLOVE) ×1 IMPLANT
GLOVE BIO SURGEON STRL SZ8 (GLOVE) IMPLANT
GLOVE BIOGEL PI IND STRL 6.5 (GLOVE) IMPLANT
GLOVE SURG ORTHO 8.5 STRL (GLOVE) ×1 IMPLANT
GOWN STRL REUS W/TWL LRG LVL3 (GOWN DISPOSABLE) ×1 IMPLANT
GRID BRACH TEMP 18GA 2.8X3X.75 (MISCELLANEOUS) ×1 IMPLANT
HOLDER FOLEY CATH W/STRAP (MISCELLANEOUS) IMPLANT
IMPL SPACEOAR VUE SYSTEM (Spacer) ×1 IMPLANT
IMPLANT SPACEOAR VUE SYSTEM (Spacer) ×1 IMPLANT
IV NS 1000ML (IV SOLUTION) ×1
IV NS 1000ML BAXH (IV SOLUTION) ×1 IMPLANT
KIT TURNOVER CYSTO (KITS) ×1 IMPLANT
NDL BRACHY 18G 5PK (NEEDLE) ×4 IMPLANT
NDL BRACHY 18G SINGLE (NEEDLE) IMPLANT
NDL PK MORGANSTERN STABILIZ (NEEDLE) ×1 IMPLANT
NEEDLE BRACHY 18G 5PK (NEEDLE) ×4 IMPLANT
NEEDLE BRACHY 18G SINGLE (NEEDLE) IMPLANT
NEEDLE PK MORGANSTERN STABILIZ (NEEDLE) ×1 IMPLANT
PACK CYSTO (CUSTOM PROCEDURE TRAY) ×1 IMPLANT
SHEATH ULTRASOUND LF (SHEATH) IMPLANT
SHEATH ULTRASOUND LTX NONSTRL (SHEATH) IMPLANT
SLEEVE SCD COMPRESS KNEE MED (STOCKING) ×1 IMPLANT
SUT BONE WAX W31G (SUTURE) IMPLANT
SYR 10ML LL (SYRINGE) ×1 IMPLANT
SYR CONTROL 10ML LL (SYRINGE) ×1 IMPLANT
TOWEL OR 17X24 6PK STRL BLUE (TOWEL DISPOSABLE) ×1 IMPLANT
UNDERPAD 30X36 HEAVY ABSORB (UNDERPADS AND DIAPERS) ×2 IMPLANT
WATER STERILE IRR 500ML POUR (IV SOLUTION) ×1 IMPLANT
quicklink brachytherapy I-125 seeds IMPLANT

## 2022-06-03 NOTE — Brief Op Note (Signed)
06/03/2022  11:18 AM  PATIENT:  Ellin Mayhew  72 y.o. male  PRE-OPERATIVE DIAGNOSIS:  PROSTATE CANCER  POST-OPERATIVE DIAGNOSIS:  PROSTATE CANCER  PROCEDURE:  Procedure(s) with comments: RADIOACTIVE SEED IMPLANT/BRACHYTHERAPY IMPLANT (N/A) - 90 MINS SPACE OAR INSTILLATION (N/A)  SURGEON:  Surgeon(s) and Role:    * Alexis Frock, MD - Primary    * Tyler Pita, MD  PHYSICIAN ASSISTANT:   ASSISTANTS: none   ANESTHESIA:   general  EBL:  1 mL   BLOOD ADMINISTERED:none  DRAINS: none   LOCAL MEDICATIONS USED:  NONE  SPECIMEN:  No Specimen  DISPOSITION OF SPECIMEN:  N/A  COUNTS:  YES  TOURNIQUET:  * No tourniquets in log *  DICTATION: .Other Dictation: Dictation Number [id number]  PLAN OF CARE: Discharge to home after PACU  PATIENT DISPOSITION:  PACU - hemodynamically stable.   Delay start of Pharmacological VTE agent (>24hrs) due to surgical blood loss or risk of bleeding: yes

## 2022-06-03 NOTE — H&P (Signed)
Maurice [name] is an 72 y.o. male.    Chief Complaint: Pre-Op Prostate Brachytherapy Seed Implantation and SPACE-OAR  HPI:   1 - Gross Hematuria / Friable Prostate - new visible blood in urine after vigorous treadmill run 2018. Non- smoker. No chronic solvent exposure. CT Urogram unremarkable. Prostate Vol 47mL. Cysto with trilobar hypertrophy and friability, no neoplasia.   2 - Moderate Risk (Favorable) Prostate Cancer - 4/12 cores up to 50% mix grade 1 and 2 cancer on eval rising PSA to 5.08. TRUS 70mL with tiny median (brachy still reasonable).   PMH sig for TNA, Appy, HLD. NO ischemic CV disease or blood thinners. 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 Maurice [name] graduated NP school in [place], going into mental health. His PCP is Maurice Guthrie MD with [facility].   Today "Maurice [name]" is seen  to proceed with prostate brachytherapy seed implantation and SPACE-OAR. Most recertn UCX negative. Had RSV few weeks ago, but back to baseline.   Past Medical History:  Diagnosis Date   Allergic rhinitis    Allergy    Arthritis    Plantar fasciitis, left     Past Surgical History:  Procedure Laterality Date   APPENDECTOMY     CATARACT EXTRACTION  05/12/2022   TONSILLECTOMY      Family History  Problem Relation Age of Onset   Arthritis Father    Cancer Father    Allergic rhinitis Father    Asthma Father    Social History:  reports that he has never smoked. He has never used smokeless tobacco. He reports current alcohol use of about 20.0 standard drinks of alcohol per week. He reports that he does not use drugs.  Allergies: No Known Allergies  No medications prior to admission.    No results found for this or any previous visit (from the past 48 hour(s)). No results found.  Review of Systems  Constitutional:  Negative for chills and fever.  All other systems reviewed and are negative.   Height 5\' 9"  (1.753 m), weight 83.9 kg. Physical Exam Vitals reviewed.  HENT:     Head: Normocephalic.   Eyes:     Pupils: Pupils are equal, round, and reactive to light.  Cardiovascular:     Rate and Rhythm: Normal rate.  Abdominal:     General: Abdomen is flat.  Genitourinary:    Comments: No CVAT at present Musculoskeletal:        General: Normal range of motion.     Cervical back: Normal range of motion.  Skin:    General: Skin is warm.  Neurological:     General: No focal deficit present.     Mental Status: He is alert.  Psychiatric:        Mood and Affect: Mood normal.      Assessment/Plan  Proceed as planned with prostate brachytehrapy seed implantation and SPACE-OAR. Risks, benefits, alternatives, expected peri-op course discussed previously and reiterated today.   Alexis Frock, MD 06/03/2022, 7:48 AM

## 2022-06-03 NOTE — Progress Notes (Signed)
Radiation Oncology         [phone] ________________________________  Name: Maurice [name] MRN: [id number]  Date: 06/03/2022  DOB: 1950-07-10       Prostate Seed Implant  CC:Maurice Hatchet, MD  No ref. provider found  DIAGNOSIS:   72 y.o. gentleman with Stage T1c adenocarcinoma of the prostate with Gleason score of 3+4, and PSA of 4.82.   Oncology History  Prostate cancer ([place])  12/14/2021 Cancer Staging   Staging form: Prostate, AJCC 8th Edition - Clinical stage from 12/14/2021: Stage IIB (cT1c, cN0, cM0, PSA: 4.8, Grade Group: 2) - Signed by Freeman Caldron, PA-C on 01/06/2022 Histopathologic type: Adenocarcinoma, NOS Stage prefix: Initial diagnosis Prostate specific antigen (PSA) range: Less than 10 Gleason primary pattern: 3 Gleason secondary pattern: 4 Gleason score: 7 Histologic grading system: 5 grade system Number of biopsy cores examined: 12 Number of biopsy cores positive: 4 Location of positive needle core biopsies: Both sides   01/06/2022 Initial Diagnosis   Malignant neoplasm of prostate ([place])       ICD-10-CM   1. Pre-op testing  Z01.818 CBG per Guidelines for Diabetes Management for Patients Undergoing Surgery ([facility], [facility], and [facility] only)    CBG per protocol    CBG per Guidelines for Diabetes Management for Patients Undergoing Surgery ([facility], [facility], and [facility] only)    CBG per protocol      PROCEDURE: Insertion of radioactive I-125 seeds into the prostate gland.  RADIATION DOSE: 145 Gy, definitive therapy.  TECHNIQUE: Maurice [name] was brought to the operating room with the urologist. He was placed in the dorsolithotomy position. He was catheterized and a rectal tube was inserted. The perineum was shaved, prepped and draped. The ultrasound probe was then introduced by me into the rectum to see the prostate gland.  TREATMENT DEVICE: I attached the needle grid to the ultrasound probe stand and anchor needles were placed.  3D PLANNING: The prostate was imaged in 3D using a  sagittal sweep of the prostate probe. These images were transferred to the planning computer. There, the prostate, urethra and rectum were defined on each axial reconstructed image. Then, the software created an optimized 3D plan and a few seed positions were adjusted. The quality of the plan was reviewed using [facility] information for the target and the following two organs at risk:  Urethra and Rectum.  Then the accepted plan was printed and handed off to the radiation therapist.  Under my supervision, the custom loading of the seeds and spacers was carried out using the quick loader.  These pre-loaded needles were then placed into the needle holder.Marland Kitchen  PROSTATE VOLUME STUDY:  Using transrectal ultrasound the volume of the prostate was verified to be 35 cc.  SPECIAL TREATMENT PROCEDURE/SUPERVISION AND HANDLING: The pre-loaded needles were then delivered by the urologist under sagittal guidance. A total of 16 needles were used to deposit 55 seeds in the prostate gland. The individual seed activity was 0.469 mCi.  SpaceOAR:  Yes  COMPLEX SIMULATION: At the end of the procedure, an anterior radiograph of the pelvis was obtained to document seed positioning and count. Cystoscopy was performed by the urologist to check the urethra and bladder.  MICRODOSIMETRY: At the end of the procedure, the patient was emitting 0.096 mR/hr at 1 meter. Accordingly, he was considered safe for hospital discharge.  PLAN: The patient will return to the radiation oncology clinic for post implant CT dosimetry in three weeks.   ________________________________  Sheral Apley Tammi Klippel, M.D.

## 2022-06-03 NOTE — Discharge Instructions (Addendum)
1 - You may have urinary urgency (bladder spasms) and bloody urine on / off x few weeks. This is normal.  2 - Call MD or go to ER for fever >102, severe pain / nausea / vomiting not relieved by medications, or acute change in medical status Radioactive Seed Implant Home Care Instructions   Activity:    Rest for the remainder of the day.  Do not drive or operate equipment today.  You may resume normal  activities in a few days as instructed by your physician, without risk of harmful radiation exposure to those around you, provided you follow the time and distance precautions on the Radiation Oncology Instruction Sheet.   Meals: Drink plenty of lipuids and eat light foods, such as gelatin or soup this evening .  You may return to normal meal plan tomorrow.  Return To Work: You may return to work as instructed by Naval architect.  Special Instruction:   If any seeds are found, use tweezers to pick up seeds and place in a glass container of any kind and bring to your physician's office.  Call your physician if any of these symptoms occur:  Persistent or heavy bleeding Urine stream diminishes or stops completely after catheter is removed Fever equal to or greater than 101 degrees F Cloudy urine with a strong foul odor Severe pain  You may feel some burning pain and/or hesitancy when you urinate after the catheter is removed.  These symptoms may increase over the next few weeks, but should diminish within forur to six weeks.  Applying moist heat to the lower abdomen or a hot tub bath may help relieve the pain.  If the discomfort becomes severe, please call your physician for additional medications. Radioactive Seed Implant Home Care Instructions   Activity:    Rest for the remainder of the day.  Do not drive or operate equipment today.  You may resume normal  activities in a few days as instructed by your physician, without risk of harmful radiation exposure to those around you, provided you  follow the time and distance precautions on the Radiation Oncology Instruction Sheet.   Meals: Drink plenty of lipuids and eat light foods, such as gelatin or soup this evening .  You may return to normal meal plan tomorrow.  Return To Work: You may return to work as instructed by Naval architect.  Special Instruction:   If any seeds are found, use tweezers to pick up seeds and place in a glass container of any kind and bring to your physician's office.  Call your physician if any of these symptoms occur:  Persistent or heavy bleeding Urine stream diminishes or stops completely after catheter is removed Fever equal to or greater than 101 degrees F Cloudy urine with a strong foul odor Severe pain  You may feel some burning pain and/or hesitancy when you urinate after the catheter is removed.  These symptoms may increase over the next few weeks, but should diminish within forur to six weeks.  Applying moist heat to the lower abdomen or a hot tub bath may help relieve the pain.  If the discomfort becomes severe, please call your physician for additional medications.

## 2022-06-03 NOTE — Anesthesia Postprocedure Evaluation (Signed)
Anesthesia Post Note  Patient: Maurice [name]  Procedure(s) Performed: RADIOACTIVE SEED IMPLANT/BRACHYTHERAPY IMPLANT (Prostate) SPACE OAR INSTILLATION (Prostate)     Patient location during evaluation: PACU Anesthesia Type: General Level of consciousness: awake and alert Pain management: pain level controlled Vital Signs Assessment: post-procedure vital signs reviewed and stable Respiratory status: spontaneous breathing, nonlabored ventilation, respiratory function stable and patient connected to nasal cannula oxygen Cardiovascular status: blood pressure returned to baseline and stable Postop Assessment: no apparent nausea or vomiting Anesthetic complications: no  No notable events documented.  Last Vitals:  Vitals:   06/03/22 1145 06/03/22 1200  BP: 134/81 139/79  Pulse: 70 72  Resp: 18 16  Temp:  36.4 C  SpO2: 99% 99%    Last Pain:  Vitals:   06/03/22 1200  TempSrc:   PainSc: 0-No pain                 Maurice [name]

## 2022-06-03 NOTE — Op Note (Signed)
[name], Maurice [name] MEDICAL RECORD NO: [id number] ACCOUNT NO: [id number] DATE OF BIRTH: [birth year] FACILITY: [facility] LOCATION: [facility] PHYSICIAN: Alexis Frock, MD  Operative Report   DATE OF PROCEDURE: 06/03/2022  PREOPERATIVE DIAGNOSIS:  Moderate risk prostate cancer.  PROCEDURE:   1.  Prostate brachytherapy seed implantation with ultrasound guidance. 2.  SpaceOAR implantation. 3.  Cystoscopy.  ESTIMATED BLOOD LOSS:  Nil.  COMPLICATIONS:  None.  SPECIMEN:  None.  FINDINGS: 1.  No evidence of brachytherapy seeds intraluminally after brachytherapy seed placement, unremarkable urinary bladder. 2.  Excellent posterior displacement of anterior rectal wall with SpaceOAR implantation.  RADIATION PARAMETERS: 16 catheters delivering 55 seeds for prescribed dose of 145 gray.  INDICATIONS:  The patient is a pleasant 72 year old man who was found on workup of elevated PSA to have multifocal moderate risk adenocarcinoma of the prostate.  Options were discussed for management including surveillance protocols versus ablative  therapy versus surgical extirpation.  He had a smaller gland size and age in his 85s.  He opted for brachytherapy.  I agreed this was optimal.  He presents for this today.  Informed consent was obtained and placed in medical record.  PROCEDURE IN DETAIL:  The patient being identified and verified, procedure being prostate brachytherapy with SpaceOAR and cystoscopy was confirmed.  Procedure timeout was performed.  Intravenous antibiotics were administered.  General anesthesia was  induced.  The patient was placed into a medium lithotomy position.  Sterile field was created, prepped and draped the patient's perineum using iodine. In situ Foley catheter was placed.  Transrectal ultrasound and grid was placed along with the prostate  anchors.  Radiation planning was performed as per Radiation Oncology note.  Plan called for 16 catheters delivering 55 seeds.  Next, the  brachytherapy seeds were administered in anterior to posterior direction as per prescribed dose with excellent  visualization via ultrasound guidance.  Attention was directed at [facility] implantation.  The prostate anchors and grid were taken away.  Prostate was taken off of anterior tension and using the introducer needle and transperineal approach approximately  1 fingerbreadth anterior to the rectal verge the SpaceOAR introducer needle was placed transperineally under ultrasound guidance to the level of the mid prostate mid gland and the anterior prerectal fat stripe.  This was corroborated by hydrodissection  with 2 mL of saline. Next 10 mL of SpaceOAR gel matrix was instilled as per manufacturer's guidelines. This resulted in excellent sonographic displacement of the anterior rectal wall away from the posterior aspect of the prostate.  Spot fluoroscopic  images revealed excellent sheath placement in anterior and oblique views.  Foley catheter was removed.  Cystourethroscopy was then performed using 16-French flexible cystoscope.  Inspection of the anterior and posterior urethra was unremarkable.  There  was no evidence of intraluminal brachytherapy seeds or cannula within the prostate.  The patient's bladder revealed no diverticula, calcifications, papillary lesions.  Similarly no brachytherapy seeds or cannula within the lumen of the bladder.   Retroflexion showed no additional findings.  Procedure was terminated.  The patient tolerated procedure well, no immediate perioperative complications.  The patient taken to postanesthesia care in stable condition.  Plan for discharge home after void.   PUS D: 06/03/2022 11:23:20 am T: 06/03/2022 1:15:00 pm  JOB: [id number] PO:[id number]

## 2022-06-03 NOTE — Anesthesia Preprocedure Evaluation (Addendum)
Anesthesia Evaluation  Patient identified by MRN, date of birth, ID band Patient awake    Reviewed: Allergy & Precautions, NPO status , Patient's Chart, lab work & pertinent test results  Airway Mallampati: III  TM Distance: >3 FB Neck ROM: Full    Dental no notable dental hx. (+) Teeth Intact, Dental Advisory Given   Pulmonary neg pulmonary ROS   Pulmonary exam normal breath sounds clear to auscultation       Cardiovascular negative cardio ROS Normal cardiovascular exam Rhythm:Regular Rate:Normal     Neuro/Psych negative neurological ROS  negative psych ROS   GI/Hepatic negative GI ROS,,,(+)     substance abuse  alcohol use  Endo/Other  negative endocrine ROS    Renal/GU negative Renal ROS  negative genitourinary   Musculoskeletal  (+) Arthritis ,    Abdominal   Peds  Hematology negative hematology ROS (+)   Anesthesia Other Findings Prostate CA  Reproductive/Obstetrics                             Anesthesia Physical Anesthesia Plan  ASA: 2  Anesthesia Plan: General   Post-op Pain Management: Tylenol PO (pre-op)*   Induction: Intravenous  PONV Risk Score and Plan: 2 and Dexamethasone, Ondansetron and Treatment may vary due to age or medical condition  Airway Management Planned: Oral ETT  Additional Equipment:   Intra-op Plan:   Post-operative Plan: Extubation in OR  Informed Consent: I have reviewed the patients History and Physical, chart, labs and discussed the procedure including the risks, benefits and alternatives for the proposed anesthesia with the patient or authorized representative who has indicated his/her understanding and acceptance.     Dental advisory given  Plan Discussed with: CRNA  Anesthesia Plan Comments:        Anesthesia Quick Evaluation

## 2022-06-03 NOTE — Transfer of Care (Signed)
Immediate Anesthesia Transfer of Care Note  Patient: Maurice [name]  Procedure(s) Performed: RADIOACTIVE SEED IMPLANT/BRACHYTHERAPY IMPLANT (Prostate) SPACE OAR INSTILLATION (Prostate)  Patient Location: PACU  Anesthesia Type:General  Level of Consciousness: awake, alert , oriented, and patient cooperative  Airway & Oxygen Therapy: Patient Spontanous Breathing  Post-op Assessment: Report given to RN and Post -op Vital signs reviewed and stable  Post vital signs: Reviewed and stable  Last Vitals:  Vitals Value Taken Time  BP 143/84 06/03/22 1127  Temp    Pulse 73 06/03/22 1128  Resp 10 06/03/22 1128  SpO2 97 % 06/03/22 1128  Vitals shown include unvalidated device data.  Last Pain:  Vitals:   06/03/22 0843  TempSrc: Oral  PainSc: 0-No pain      Patients Stated Pain Goal: 5 ([id number])  Complications: No notable events documented.

## 2022-06-03 NOTE — Anesthesia Procedure Notes (Signed)
Procedure Name: Intubation Date/Time: 06/03/2022 10:24 AM  Performed by: Rogers Blocker, CRNAPre-anesthesia Checklist: Patient identified, Emergency Drugs available, Suction available and Patient being monitored Patient Re-evaluated:Patient Re-evaluated prior to induction Oxygen Delivery Method: Circle System Utilized Preoxygenation: Pre-oxygenation with 100% oxygen Induction Type: IV induction Ventilation: Mask ventilation without difficulty Laryngoscope Size: Mac and 4 Grade View: Grade I Tube type: Oral Tube size: 7.0 mm Number of attempts: 1 Airway Equipment and Method: Stylet and Bite block Placement Confirmation: ETT inserted through vocal cords under direct vision, positive ETCO2 and breath sounds checked- equal and bilateral Secured at: 22 cm Tube secured with: Tape Dental Injury: Teeth and Oropharynx as per pre-operative assessment

## 2022-06-07 ENCOUNTER — Encounter (HOSPITAL_BASED_OUTPATIENT_CLINIC_OR_DEPARTMENT_OTHER): Payer: Self-pay | Admitting: Urology

## 2022-06-23 ENCOUNTER — Telehealth: Payer: Self-pay | Admitting: *Deleted

## 2022-06-23 ENCOUNTER — Ambulatory Visit: Payer: Self-pay | Admitting: Urology

## 2022-06-23 ENCOUNTER — Ambulatory Visit: Payer: Federal, State, Local not specified - PPO | Admitting: Radiation Oncology

## 2022-06-23 NOTE — Telephone Encounter (Signed)
Called patient to remind of post seed appts. for- 06/24/22, spoke with patient and he is aware of these appts.

## 2022-06-24 ENCOUNTER — Ambulatory Visit
Admission: RE | Admit: 2022-06-24 | Discharge: 2022-06-24 | Disposition: A | Discharge status: Home / Self Care | Payer: Federal, State, Local not specified - PPO | Source: Ambulatory Visit | Attending: Urology | Admitting: Urology

## 2022-06-24 ENCOUNTER — Encounter: Payer: Self-pay | Admitting: Urology

## 2022-06-24 ENCOUNTER — Ambulatory Visit
Admission: RE | Admit: 2022-06-24 | Discharge: 2022-06-24 | Disposition: A | Discharge status: Home / Self Care | Payer: Federal, State, Local not specified - PPO | Source: Ambulatory Visit | Attending: Radiation Oncology | Admitting: Radiation Oncology

## 2022-06-24 VITALS — BP 138/84 | HR 72 | Temp 97.2°F | Resp 18 | Ht 69.0 in | Wt 192.0 lb

## 2022-06-24 DIAGNOSIS — Z510 Encounter for antineoplastic radiation therapy: Secondary | ICD-10-CM | POA: Diagnosis not present

## 2022-06-24 DIAGNOSIS — C61 Malignant neoplasm of prostate: Secondary | ICD-10-CM | POA: Diagnosis present

## 2022-06-24 MED ORDER — TAMSULOSIN HCL 0.4 MG PO CAPS: 0.4000 mg | ORAL_CAPSULE | Freq: Two times a day (BID) | ORAL | 1 refills | Status: AC

## 2022-06-24 NOTE — Progress Notes (Signed)
  Radiation Oncology         [phone] ________________________________  Name: Maurice [name] MRN: [id number]  Date: 06/24/2022  DOB: 1950-12-15  COMPLEX SIMULATION NOTE  NARRATIVE:  The patient was brought to the CT Simulation planning suite today following prostate seed implantation approximately one month ago.  Identity was confirmed.  All relevant records and images related to the planned course of therapy were reviewed.  Then, the patient was set-up supine.  CT images were obtained.  The CT images were loaded into the planning software.  Then the prostate and rectum were contoured.  Treatment planning then occurred.  The implanted iodine 125 seeds were identified by the physics staff for projection of radiation distribution  I have requested : 3D Simulation  I have requested a DVH of the following structures: Prostate and rectum.    ________________________________  Artist Pais Kathrynn Running, M.D.

## 2022-06-24 NOTE — Addendum Note (Signed)
Encounter addended by: Birdena Crandall, LPN on: 8/76/8115 12:03 PM  Actions taken: Clinical Note Signed

## 2022-06-24 NOTE — Progress Notes (Addendum)
Nursing interview for  72 y.o. gentleman with Stage T1c adenocarcinoma of the prostate with Gleason score of 3+4, and PSA of 4.82.  Patient identity verified x2.  Patient reports groin pain 5/10, w/ moderate urinary and sexual challenges. Patient states w/ Viagra sex is not a problem most of the time. Patient also states "He lost 3 seeds the night of seed implantation, during urination."  Meaningful use complete. I-PSS score of 13-moderate SHIM score of -17 Urinary management medications- Tamsulosin Urology appt- August 30, 2022 w/ Dr. Berneice Heinrich at [facility] [place] [facility]    BP 138/84 (BP Location: Left Arm, Patient Position: Sitting, Cuff Size: Normal)   Pulse 72   Temp (!) 97.2 F (36.2 C) (Temporal)   Resp 18   Ht 5\' 9"  (1.753 m)   Wt 192 lb (87.1 kg)   SpO2 98%   BMI 28.35 kg/m   This concludes the interview.   Ruel Favors, LPN

## 2022-06-24 NOTE — Progress Notes (Signed)
Radiation Oncology         [phone] ________________________________  Name: Maurice [name] MRN: [id number]  Date: 06/24/2022  DOB: [birth year]  Post-Seed Follow-Up Visit Note  CC: Maurice Penna, MD  Loletta Parish., *  Diagnosis:   72 y.o. gentleman with Stage T1c adenocarcinoma of the prostate with Gleason score of 3+4, and PSA of 4.82.     ICD-10-CM   1. Malignant neoplasm of prostate  C61     2. Prostate cancer  C61       Interval Since Last Radiation:  3 weeks 06/03/22:  Insertion of radioactive I-125 seeds into the prostate gland; 145 Gy, definitive therapy with placement of SpaceOAR gel.  Narrative:  The patient returns today for routine follow-up.  He is complaining of increased urinary frequency and urinary hesitation symptoms. He filled out a questionnaire regarding urinary function today providing and overall IPSS score of 13 characterizing his symptoms as moderate with nocturia x3, frequency and weaker flow of stream.  His pre-implant score was 5.  His main complaint is moderate to severe pain in the penis at the end of his stream and approximately 30 minutes to an hour thereafter.  He did recently start taking Flomax in the mornings and has noticed significant relief throughout the day but by 7 or 8 PM, the pain returns.  He denies any gross hematuria, straining to void, malodorous urine, fever, chills or incontinence.  He denies any abdominal pain or bowel symptoms.  He reports a healthy appetite and is maintaining his weight.  He has not noticed any significant impact on his energy level and has attempted to remain active but notices that the penile pain and urgency/frequency increases with activity.  He also reports having severe pain with ejaculation approximately 2 days ago but otherwise, is pleased with his progress to date.  ALLERGIES:  has No Known Allergies.  Meds: Current Outpatient Medications  Medication Sig Dispense Refill   Calcium Carbonate Antacid (TUMS  PO) Take 3 tablets by mouth as needed (reflux).     ibuprofen (ADVIL) 200 MG tablet Take 400 mg by mouth daily as needed for mild pain.     oxymetazoline (AFRIN) 0.05 % nasal spray Place 1-5 sprays into both nostrils at bedtime as needed for congestion.     senna-docusate (SENOKOT-S) 8.6-50 MG tablet Take 1 tablet by mouth 2 (two) times daily. While taking strong pain meds to prevent constipation 10 tablet 0   sildenafil (VIAGRA) 100 MG tablet Take 100 mg by mouth daily as needed for erectile dysfunction.     tamsulosin (FLOMAX) 0.4 MG CAPS capsule Take 1 capsule (0.4 mg total) by mouth daily as needed (for urinary urgency / weak stream after prostate radiation). 30 capsule 11   traMADol (ULTRAM) 50 MG tablet Take 1-2 tablets (50-100 mg total) by mouth every 6 (six) hours as needed for moderate pain or severe pain (post-operatively). 15 tablet 0   traZODone (DESYREL) 50 MG tablet Take 1 tablet (50 mg total) by mouth at bedtime as needed for sleep. 15 tablet 1   No current facility-administered medications for this encounter.    Physical Findings: In general this is a well appearing Caucasian male in no acute distress. He's alert and oriented x4 and appropriate throughout the examination. Cardiopulmonary assessment is negative for acute distress and he exhibits normal effort.   Lab Findings: Lab Results  Component Value Date   WBC 13.6 (H) 03/20/2022   HGB 13.2 03/20/2022   HCT  40.5 03/20/2022   MCV 96.9 03/20/2022   PLT 498 (H) 03/20/2022    Radiographic Findings:  Patient underwent CT imaging in our clinic for post implant dosimetry. The CT will be reviewed by Dr. Kathrynn Running to confirm there is an adequate distribution of radioactive seeds throughout the prostate gland and ensure that there are no seeds in or near the rectum.  We suspect the final radiation plan and dosimetry will show appropriate coverage of the prostate gland. He understands that we will call and inform him of any  unexpected findings on further review of his imaging and dosimetry.  Impression/Plan: 72 y.o. gentleman with Stage T1c adenocarcinoma of the prostate with Gleason score of 3+4, and PSA of 4.82.  The patient is recovering from the effects of radiation. His urinary symptoms should gradually improve over the next 4-6 months. We talked about this today. He is encouraged by his improvement already and we are going to increase the Flomax to twice daily and he will begin taking Advil 600 mg p.o. twice daily to help better manage the radiation-induced prostatitis.  He is otherwise pleased with his outcome. We also talked about long-term follow-up for prostate cancer following seed implant. He understands that ongoing PSA determinations and digital rectal exams will help perform surveillance to rule out disease recurrence. He has a follow up appointment scheduled with Dr. Berneice Heinrich on 08/30/22. He understands what to expect with his PSA measures. Patient was also educated today about some of the long-term effects from radiation including a small risk for rectal bleeding and possibly erectile dysfunction. We talked about some of the general management approaches to these potential complications. However, I did encourage the patient to contact our office or return at any point if he has questions or concerns related to his previous radiation and prostate cancer.    Marguarite Arbour, PA-C

## 2022-06-25 ENCOUNTER — Encounter: Payer: Self-pay | Admitting: Radiation Oncology

## 2022-06-25 DIAGNOSIS — C61 Malignant neoplasm of prostate: Secondary | ICD-10-CM | POA: Diagnosis not present

## 2022-06-25 NOTE — Radiation Completion Notes (Signed)
Patient Name: Maurice [name], Maurice [name] MRN: [id number] Date of Birth: 1950/10/16 Referring Physician: Sebastian Ache, M.D. Date of Service: 2022-06-25 Radiation Oncologist: Margaretmary Bayley, M.D. [facility]                             RADIATION ONCOLOGY END OF TREATMENT NOTE     Diagnosis: C61 Malignant neoplasm of prostate Staging on 2021-12-14: Prostate cancer T=cT1c, N=cN0, M=cM0 Intent: Curative     ==========DELIVERED PLANS==========  Prostate Seed Implant Date: 2022-06-02   Plan Name: Prostate Seed Implant Site: Prostate Technique: Radioactive Seed Implant I-125 Mode: Brachytherapy Dose Per Fraction: 145 Gy Prescribed Dose (Delivered / Prescribed): 145 Gy / 145 Gy Prescribed Fxs (Delivered / Prescribed): 1 / 1     ==========ON TREATMENT VISIT DATES========== 2022-06-02     ==========UPCOMING VISITS==========

## 2022-06-27 NOTE — Progress Notes (Signed)
  Radiation Oncology         [phone] ________________________________  Name: Maurice [name] MRN: [id number]  Date: 06/25/2022  DOB: January 13, 1951  3D Planning Note   Prostate Brachytherapy Post-Implant Dosimetry  Diagnosis:  72 y.o. gentleman with Stage T1c adenocarcinoma of the prostate with Gleason score of 3+4, and PSA of 4.82.   Narrative: On a previous date, Maurice [name] returned following prostate seed implantation for post implant planning. He underwent CT scan complex simulation to delineate the three-dimensional structures of the pelvis and demonstrate the radiation distribution.  Since that time, the seed localization, and complex isodose planning with dose volume histograms have now been completed.  Results:   Prostate Coverage - The dose of radiation delivered to the 90% or more of the prostate gland (D90) was 110.13% of the prescription dose. This exceeds our goal of greater than 90%. Rectal Sparing - The volume of rectal tissue receiving the prescription dose or higher was 0.0 cc. This falls under our thresholds tolerance of 1.0 cc.  Impression: The prostate seed implant appears to show adequate target coverage and appropriate rectal sparing.  Plan:  The patient will continue to follow with urology for ongoing PSA determinations. I would anticipate a high likelihood for local tumor control with minimal risk for rectal morbidity.  ________________________________  Artist Pais Kathrynn Running, M.D.

## 2022-07-15 ENCOUNTER — Encounter: Payer: Self-pay | Admitting: *Deleted

## 2022-07-15 ENCOUNTER — Inpatient Hospital Stay: Payer: Federal, State, Local not specified - PPO | Attending: Adult Health | Admitting: *Deleted

## 2022-07-15 DIAGNOSIS — C61 Malignant neoplasm of prostate: Secondary | ICD-10-CM

## 2022-07-15 NOTE — Progress Notes (Signed)
2 Identifiers were used for verification purposes for this telephone visit. No vitals were taken as this was not an in-person visit. Allergy and medication list was reviewed and reconciled. Pt stated he has pain which he describes as pressure when he urinates and defecates. He rates it a 5/10. Pt states this has been going on for 6 weeks now which is his time post - radiation. He states the one thing that makes it better is when he takes the Flomax twice daily but if he forgets a dose the pain is unbearable. Also with the Flomax he gets up twice at night to urinate without it , up to 5 times at night. He does have urine hesitancy with almost every void he says and moderate urgency as well. He had not reached out to Dr. Berneice Heinrich about this but did talk to Ashlyn B. and was satisfied with the care and concern she gave. Pt says he feels the same pressure and pain when he has a bowel movement but no urgency. He will see Dr. Berneice Heinrich on June 24th and PCP in November. He has seen dermatologist for a full body check on May 6th. SCP reviewed and completed.

## 2022-10-14 ENCOUNTER — Ambulatory Visit: Payer: Self-pay | Admitting: Surgery

## 2022-12-08 ENCOUNTER — Encounter (HOSPITAL_BASED_OUTPATIENT_CLINIC_OR_DEPARTMENT_OTHER): Payer: Self-pay | Admitting: Surgery

## 2022-12-08 ENCOUNTER — Other Ambulatory Visit: Payer: Self-pay

## 2022-12-08 NOTE — Progress Notes (Signed)
Spoke w/ via phone for pre-op interview---pt Lab needs dos---- none        Lab results------ COVID test -----patient states asymptomatic no test needed Arrive at -------530 12-20-2022 NPO after MN NO Solid Food.  Clear liquids from MN until---430 Med rec completed Medications to take morning of surgery -----tamsulosin Diabetic medication -----n/a Patient instructed no nail polish to be worn day of surgery Patient instructed to bring photo id and insurance card day of surgery Patient aware to have Driver (ride ) / caregiver   wife Maurice [name]  for 24 hours after surgery -  Patient Special Instructions -----none Pre-Op special Instructions -----none Patient verbalized understanding of instructions that were given at this phone interview. Patient denies chest pain, sob, fever, cough at the interview.

## 2022-12-17 ENCOUNTER — Encounter (HOSPITAL_BASED_OUTPATIENT_CLINIC_OR_DEPARTMENT_OTHER): Payer: Self-pay | Admitting: Surgery

## 2022-12-17 DIAGNOSIS — K429 Umbilical hernia without obstruction or gangrene: Secondary | ICD-10-CM | POA: Diagnosis present

## 2022-12-17 DIAGNOSIS — L720 Epidermal cyst: Secondary | ICD-10-CM | POA: Diagnosis present

## 2022-12-17 NOTE — H&P (Signed)
PROVIDER: Josaiah Muhammed Myra Rude, MD   Chief Complaint: Follow-up (Umbilical hernia, skin lesion)  History of Present Illness:  Patient returns to practice for follow-up. He was seen approximately 1 year ago regarding an umbilical hernia and an epidermal inclusion cyst on the left shoulder. Due to a variety of circumstances the patient has postponed having these procedures. He now would like to proceed with umbilical hernia repair. He feels that the hernia has become larger. He has intermittent episodes of discomfort. He has had no signs or symptoms of obstruction. Patient also would like to have the epidermal inclusion cyst excised from the left shoulder.   Review of Systems: A complete review of systems was obtained from the patient. I have reviewed this information and discussed as appropriate with the patient. See HPI as well for other ROS.  Review of Systems  Constitutional: Negative.  HENT: Negative.  Eyes: Negative.  Respiratory: Negative.  Cardiovascular: Negative.  Gastrointestinal:  Enlarging umbilical hernia  Genitourinary: Negative.  Musculoskeletal: Negative.  Skin:  Cyst left posterior shoulder  Neurological: Negative.  Endo/Heme/Allergies: Negative.  Psychiatric/Behavioral: Negative.    Medical History: History reviewed. No pertinent past medical history.  Patient Active Problem List  Diagnosis  Umbilical hernia without obstruction or gangrene  Epidermal cyst   Past Surgical History:  Procedure Laterality Date  APPENDECTOMY  TONSILLECTOMY    No Known Allergies  Current Outpatient Medications on File Prior to Visit  Medication Sig Dispense Refill  tamsulosin (FLOMAX) 0.4 mg capsule TAKE 1 CAPSULE DAILY AS NEEDED (FOR URINARY URGENCY / WEAK STREAM AFTER PROSTATE RADIATION).  oxymetazoline (AFRIN, OXYMETAZOLINE,) 0.05 % nasal spray Spray 2 sprays twice a day by intranasal route.  sildenafiL (VIAGRA) 100 MG tablet   No current facility-administered  medications on file prior to visit.   Family History  Problem Relation Age of Onset  Skin cancer Father  Obesity Sister  Deep vein thrombosis (DVT or abnormal blood clot formation) Sister    Social History   Tobacco Use  Smoking Status Never  Smokeless Tobacco Never    Social History   Socioeconomic History  Marital status: Unknown  Tobacco Use  Smoking status: Never  Smokeless tobacco: Never  Vaping Use  Vaping status: Never Used  Substance and Sexual Activity  Alcohol use: Yes  Drug use: Never   Social Determinants of Health   Food Insecurity: No Food Insecurity (03/19/2022)  Received from [facility]  Hunger Vital Sign  Worried About Running Out of Food in the Last Year: Never true  Ran Out of Food in the Last Year: Never true  Transportation Needs: No Transportation Needs (03/19/2022)  Received from [facility]  Lack of Transportation (Medical): No  Lack of Transportation (Non-Medical): No   Objective:   Vitals:  Weight: 86.6 kg (191 lb)  Height: 175.3 cm (5\' 9" )   Body mass index is 28.21 kg/m.  Physical Exam   GENERAL APPEARANCE Comfortable, no acute issues Development: normal Gross deformities: none  SKIN Rash, lesions, ulcers: none Induration, erythema: none Nodules: On the posterior left shoulder is a 1.5 cm lesion consistent with an epidermal inclusion cyst. There is no sign of infection.  EYES Conjunctiva and lids: normal Pupils: equal and reactive  EARS, NOSE, MOUTH, THROAT External ears: no lesion or deformity External nose: no lesion or deformity Hearing: grossly normal  ABDOMEN There is an obvious umbilical hernia. This extends towards the right. It measures approximately 3 cm in size. It is  partially reducible. Fascial defect measures approximately 2 cm in diameter.  GENITOURINARY/RECTAL Not assessed  MUSCULOSKELETAL Station and gait: normal Digits and nails: no clubbing or cyanosis Muscle  strength: grossly normal all extremities Range of motion: grossly normal all extremities Deformity: none  LYMPHATIC Cervical: none palpable Supraclavicular: none palpable  PSYCHIATRIC Oriented to person, place, and time: yes Mood and affect: normal for situation Judgment and insight: appropriate for situation   Assessment and Plan:   Umbilical hernia without obstruction or gangrene Epidermal cyst  Patient returns to my practice with complaints of an enlarging umbilical hernia and a persistent left shoulder epidermal inclusion cyst. Patient would like to proceed with surgical repair of his umbilical hernia and excision of the cyst from the left shoulder.  We again discussed umbilical hernia repair. We discussed using prosthetic mesh patch. We discussed restrictions on his activities after the procedure. We also discussed concurrently excising the epidermal cyst from the left shoulder. We discussed his postoperative recovery to be anticipated. The patient understands and wishes to proceed in the near future.  Darnell Level, MD [facility] practice Office: [phone]

## 2022-12-19 NOTE — Anesthesia Preprocedure Evaluation (Signed)
Anesthesia Evaluation  Patient identified by MRN, date of birth, ID band Patient awake    Reviewed: Allergy & Precautions, NPO status , Patient's Chart, lab work & pertinent test results  Airway Mallampati: II  TM Distance: >3 FB Neck ROM: Full    Dental  (+) Dental Advisory Given, Teeth Intact   Pulmonary neg pulmonary ROS   Pulmonary exam normal breath sounds clear to auscultation       Cardiovascular negative cardio ROS Normal cardiovascular exam Rhythm:Regular Rate:Normal     Neuro/Psych negative neurological ROS  negative psych ROS   GI/Hepatic ,GERD  ,,(+)     substance abuse  alcohol use  Endo/Other  negative endocrine ROS    Renal/GU negative Renal ROS     Musculoskeletal  (+) Arthritis ,    Abdominal   Peds  Hematology negative hematology ROS (+)   Anesthesia Other Findings Prostate CA  Reproductive/Obstetrics                             Anesthesia Physical Anesthesia Plan  ASA: 2  Anesthesia Plan: General   Post-op Pain Management: Tylenol PO (pre-op)*   Induction: Intravenous  PONV Risk Score and Plan: 4 or greater and Dexamethasone, Ondansetron and Treatment may vary due to age or medical condition  Airway Management Planned: Oral ETT  Additional Equipment: None  Intra-op Plan:   Post-operative Plan: Extubation in OR  Informed Consent: I have reviewed the patients History and Physical, chart, labs and discussed the procedure including the risks, benefits and alternatives for the proposed anesthesia with the patient or authorized representative who has indicated his/her understanding and acceptance.     Dental advisory given  Plan Discussed with: CRNA  Anesthesia Plan Comments:        Anesthesia Quick Evaluation

## 2022-12-20 ENCOUNTER — Other Ambulatory Visit: Payer: Self-pay

## 2022-12-20 ENCOUNTER — Encounter (HOSPITAL_BASED_OUTPATIENT_CLINIC_OR_DEPARTMENT_OTHER): Payer: Self-pay | Admitting: Surgery

## 2022-12-20 ENCOUNTER — Ambulatory Visit (HOSPITAL_BASED_OUTPATIENT_CLINIC_OR_DEPARTMENT_OTHER)
Admission: RE | Admit: 2022-12-20 | Discharge: 2022-12-20 | Disposition: A | Discharge status: Home / Self Care | Payer: Federal, State, Local not specified - PPO | Attending: Surgery | Admitting: Surgery

## 2022-12-20 ENCOUNTER — Encounter (HOSPITAL_BASED_OUTPATIENT_CLINIC_OR_DEPARTMENT_OTHER)
Admission: RE | Disposition: A | Discharge status: Home / Self Care | Payer: Self-pay | Source: Home / Self Care | Attending: Surgery

## 2022-12-20 ENCOUNTER — Ambulatory Visit (HOSPITAL_BASED_OUTPATIENT_CLINIC_OR_DEPARTMENT_OTHER): Payer: Federal, State, Local not specified - PPO | Admitting: Anesthesiology

## 2022-12-20 ENCOUNTER — Ambulatory Visit (HOSPITAL_BASED_OUTPATIENT_CLINIC_OR_DEPARTMENT_OTHER): Payer: Self-pay | Admitting: Anesthesiology

## 2022-12-20 DIAGNOSIS — L720 Epidermal cyst: Secondary | ICD-10-CM | POA: Diagnosis present

## 2022-12-20 DIAGNOSIS — Z01818 Encounter for other preprocedural examination: Secondary | ICD-10-CM

## 2022-12-20 DIAGNOSIS — M1990 Unspecified osteoarthritis, unspecified site: Secondary | ICD-10-CM | POA: Insufficient documentation

## 2022-12-20 DIAGNOSIS — K219 Gastro-esophageal reflux disease without esophagitis: Secondary | ICD-10-CM | POA: Diagnosis not present

## 2022-12-20 DIAGNOSIS — K429 Umbilical hernia without obstruction or gangrene: Secondary | ICD-10-CM | POA: Diagnosis present

## 2022-12-20 DIAGNOSIS — Z923 Personal history of irradiation: Secondary | ICD-10-CM | POA: Insufficient documentation

## 2022-12-20 DIAGNOSIS — Z8546 Personal history of malignant neoplasm of prostate: Secondary | ICD-10-CM | POA: Diagnosis not present

## 2022-12-20 HISTORY — PX: CYST EXCISION: SHX5701

## 2022-12-20 HISTORY — PX: UMBILICAL HERNIA REPAIR: SHX196

## 2022-12-20 HISTORY — DX: Pneumonia, unspecified organism: J18.9

## 2022-12-20 HISTORY — DX: Gastro-esophageal reflux disease without esophagitis: K21.9

## 2022-12-20 SURGERY — REPAIR, HERNIA, UMBILICAL, ADULT
Anesthesia: General | Site: Shoulder

## 2022-12-20 MED ORDER — TRAMADOL HCL 50 MG PO TABS: 50.0000 mg | ORAL_TABLET | Freq: Four times a day (QID) | ORAL | 0 refills | Status: DC | PRN

## 2022-12-20 MED ORDER — LIDOCAINE 2% (20 MG/ML) 5 ML SYRINGE
INTRAMUSCULAR | Status: DC | PRN
  Administered 2022-12-20: 80 mg via INTRAVENOUS

## 2022-12-20 MED ORDER — FENTANYL CITRATE (PF) 250 MCG/5ML IJ SOLN
INTRAMUSCULAR | Status: DC | PRN
  Administered 2022-12-20 (×2): 25 ug via INTRAVENOUS
  Administered 2022-12-20 (×2): 50 ug via INTRAVENOUS

## 2022-12-20 MED ORDER — 0.9 % SODIUM CHLORIDE (POUR BTL) OPTIME
TOPICAL | Status: DC | PRN
  Administered 2022-12-20: 500 mL

## 2022-12-20 MED ORDER — ACETAMINOPHEN 500 MG PO TABS
1000.0000 mg | ORAL_TABLET | Freq: Once | ORAL | Status: AC
  Administered 2022-12-20: 1000 mg via ORAL

## 2022-12-20 MED ORDER — FENTANYL CITRATE (PF) 250 MCG/5ML IJ SOLN
INTRAMUSCULAR | Status: AC
  Filled 2022-12-20: qty 5

## 2022-12-20 MED ORDER — HYDROMORPHONE HCL 1 MG/ML IJ SOLN: 0.2500 mg | INTRAMUSCULAR | Status: DC | PRN

## 2022-12-20 MED ORDER — MIDAZOLAM HCL 2 MG/2ML IJ SOLN
INTRAMUSCULAR | Status: AC
  Filled 2022-12-20: qty 2

## 2022-12-20 MED ORDER — ACETAMINOPHEN 500 MG PO TABS
ORAL_TABLET | ORAL | Status: AC
  Filled 2022-12-20: qty 2

## 2022-12-20 MED ORDER — BUPIVACAINE HCL (PF) 0.5 % IJ SOLN
INTRAMUSCULAR | Status: DC | PRN
  Administered 2022-12-20: 26 mL

## 2022-12-20 MED ORDER — DEXAMETHASONE SODIUM PHOSPHATE 10 MG/ML IJ SOLN
INTRAMUSCULAR | Status: DC | PRN
  Administered 2022-12-20: 10 mg via INTRAVENOUS

## 2022-12-20 MED ORDER — ROCURONIUM BROMIDE 10 MG/ML (PF) SYRINGE
PREFILLED_SYRINGE | INTRAVENOUS | Status: DC | PRN
  Administered 2022-12-20: 50 mg via INTRAVENOUS

## 2022-12-20 MED ORDER — MIDAZOLAM HCL 2 MG/2ML IJ SOLN
INTRAMUSCULAR | Status: DC | PRN
  Administered 2022-12-20: 2 mg via INTRAVENOUS

## 2022-12-20 MED ORDER — ONDANSETRON HCL 4 MG/2ML IJ SOLN
INTRAMUSCULAR | Status: DC | PRN
  Administered 2022-12-20: 4 mg via INTRAVENOUS

## 2022-12-20 MED ORDER — OXYCODONE HCL 5 MG PO TABS: 5.0000 mg | ORAL_TABLET | Freq: Once | ORAL | Status: DC | PRN

## 2022-12-20 MED ORDER — CHLORHEXIDINE GLUCONATE CLOTH 2 % EX PADS: 6.0000 | MEDICATED_PAD | Freq: Once | CUTANEOUS | Status: DC

## 2022-12-20 MED ORDER — LACTATED RINGERS IV SOLN: INTRAVENOUS | Status: DC

## 2022-12-20 MED ORDER — OXYCODONE HCL 5 MG/5ML PO SOLN: 5.0000 mg | Freq: Once | ORAL | Status: DC | PRN

## 2022-12-20 MED ORDER — PROPOFOL 10 MG/ML IV BOLUS
INTRAVENOUS | Status: DC | PRN
  Administered 2022-12-20: 140 mg via INTRAVENOUS

## 2022-12-20 MED ORDER — SUGAMMADEX SODIUM 200 MG/2ML IV SOLN
INTRAVENOUS | Status: DC | PRN
  Administered 2022-12-20: 200 mg via INTRAVENOUS

## 2022-12-20 MED ORDER — CEFAZOLIN SODIUM-DEXTROSE 2-4 GM/100ML-% IV SOLN
2.0000 g | INTRAVENOUS | Status: AC
  Administered 2022-12-20: 2 g via INTRAVENOUS

## 2022-12-20 MED ORDER — DROPERIDOL 2.5 MG/ML IJ SOLN: 0.6250 mg | Freq: Once | INTRAMUSCULAR | Status: DC | PRN

## 2022-12-20 MED ORDER — CEFAZOLIN SODIUM-DEXTROSE 2-4 GM/100ML-% IV SOLN
INTRAVENOUS | Status: AC
  Filled 2022-12-20: qty 100

## 2022-12-20 MED ORDER — EPHEDRINE SULFATE-NACL 50-0.9 MG/10ML-% IV SOSY
PREFILLED_SYRINGE | INTRAVENOUS | Status: DC | PRN
  Administered 2022-12-20 (×4): 5 mg via INTRAVENOUS

## 2022-12-20 MED ORDER — PROPOFOL 10 MG/ML IV BOLUS
INTRAVENOUS | Status: AC
  Filled 2022-12-20: qty 20

## 2022-12-20 SURGICAL SUPPLY — 36 items
ADH SKN CLS APL DERMABOND .7 (GAUZE/BANDAGES/DRESSINGS) ×4
APL PRP STRL LF DISP 70% ISPRP (MISCELLANEOUS) ×2
APL SKNCLS STERI-STRIP NONHPOA (GAUZE/BANDAGES/DRESSINGS) ×2
BALL CTTN LRG ABS STRL LF (GAUZE/BANDAGES/DRESSINGS)
BENZOIN TINCTURE PRP APPL 2/3 (GAUZE/BANDAGES/DRESSINGS) ×2 IMPLANT
BLADE CLIPPER SENSICLIP SURGIC (BLADE) IMPLANT
BLADE SURG 15 STRL LF DISP TIS (BLADE) ×2 IMPLANT
BLADE SURG 15 STRL SS (BLADE) ×2
CHLORAPREP W/TINT 26 (MISCELLANEOUS) ×2 IMPLANT
COTTONBALL LRG STERILE PKG (GAUZE/BANDAGES/DRESSINGS) IMPLANT
COVER BACK TABLE 60X90IN (DRAPES) ×2 IMPLANT
COVER MAYO STAND STRL (DRAPES) ×2 IMPLANT
DERMABOND ADVANCED .7 DNX12 (GAUZE/BANDAGES/DRESSINGS) IMPLANT
DRAPE LAPAROTOMY 100X72 PEDS (DRAPES) ×2 IMPLANT
DRAPE UTILITY XL STRL (DRAPES) ×2 IMPLANT
ELECT REM PT RETURN 9FT ADLT (ELECTROSURGICAL) ×2
ELECTRODE REM PT RTRN 9FT ADLT (ELECTROSURGICAL) ×2 IMPLANT
GAUZE SPONGE 4X4 12PLY STRL (GAUZE/BANDAGES/DRESSINGS) IMPLANT
GLOVE SURG ORTHO 8.0 STRL STRW (GLOVE) ×2 IMPLANT
GOWN STRL REUS W/TWL LRG LVL3 (GOWN DISPOSABLE) ×2 IMPLANT
KIT TURNOVER CYSTO (KITS) ×2 IMPLANT
MESH VENTRALEX ST 1-7/10 CRC S (Mesh General) IMPLANT
NDL HYPO 25X1 1.5 SAFETY (NEEDLE) ×2 IMPLANT
NEEDLE HYPO 25X1 1.5 SAFETY (NEEDLE) ×2
NS IRRIG 500ML POUR BTL (IV SOLUTION) ×2 IMPLANT
PACK BASIN DAY SURGERY FS (CUSTOM PROCEDURE TRAY) ×2 IMPLANT
PENCIL SMOKE EVACUATOR (MISCELLANEOUS) ×2 IMPLANT
SLEEVE SCD COMPRESS KNEE MED (STOCKING) ×2 IMPLANT
STRIP CLOSURE SKIN 1/2X4 (GAUZE/BANDAGES/DRESSINGS) IMPLANT
SUT MNCRL AB 4-0 PS2 18 (SUTURE) ×2 IMPLANT
SUT NOVA 0 T19/GS 22DT (SUTURE) ×2 IMPLANT
SUT VIC AB 3-0 SH 8-18 (SUTURE) ×2 IMPLANT
SYR CONTROL 10ML LL (SYRINGE) ×2 IMPLANT
TOWEL OR 17X24 6PK STRL BLUE (TOWEL DISPOSABLE) ×2 IMPLANT
TUBE CONNECTING 12X1/4 (SUCTIONS) IMPLANT
YANKAUER SUCT BULB TIP NO VENT (SUCTIONS) ×2 IMPLANT

## 2022-12-20 NOTE — Anesthesia Procedure Notes (Signed)
Procedure Name: Intubation Date/Time: 12/20/2022 7:39 AM  Performed by: Dairl Ponder, CRNAPre-anesthesia Checklist: Patient identified, Emergency Drugs available, Suction available and Patient being monitored Patient Re-evaluated:Patient Re-evaluated prior to induction Oxygen Delivery Method: Circle System Utilized Preoxygenation: Pre-oxygenation with 100% oxygen Induction Type: IV induction Ventilation: Mask ventilation without difficulty Laryngoscope Size: Mac and 4 Grade View: Grade I Tube type: Oral Tube size: 7.5 mm Number of attempts: 1 Airway Equipment and Method: Stylet and Oral airway Placement Confirmation: ETT inserted through vocal cords under direct vision, positive ETCO2 and breath sounds checked- equal and bilateral Secured at: 23 cm Tube secured with: Tape Dental Injury: Teeth and Oropharynx as per pre-operative assessment

## 2022-12-20 NOTE — Interval H&P Note (Signed)
History and Physical Interval Note:  12/20/2022 7:04 AM  Maurice [name]  has presented today for surgery, with the diagnosis of UMBILICAL HERNIA EPIDEMAL CYST LEFT SHOULDER.  The various methods of treatment have been discussed with the patient and family. After consideration of risks, benefits and other options for treatment, the patient has consented to    Procedure(s): OPEN HERNIA REPAIR UMBILICAL WITH MESH (N/A) EXCISION EPIDERMAL CYST REMOVAL LEFT SHOULDER (Left) as a surgical intervention.    The patient's history has been reviewed, patient examined, no change in status, stable for surgery.  I have reviewed the patient's chart and labs.  Questions were answered to the patient's satisfaction.    Darnell Level, MD [facility] practice Office: [phone]   Darnell Level

## 2022-12-20 NOTE — Op Note (Signed)
Umbilical Hernia, Open, Procedure Note  Pre-operative Diagnosis: Umbilical hernia, reducible  Post-operative Diagnosis: same  Procedure: 1. Open repair of reducible umbilical hernia with Ventralex ST mesh patch (4.3 cm); 2. Excision of epidermal cyst, left posterior shoulder, 1.0 cm, subcutaneous  Surgeon:  Darnell Level, MD  Assistant:  Saunders Glance, PA-C  Anesthesia:  General  Preparation:  Chlora-prep  Estimated Blood Loss: minimal  Complications:  none  Indications: Patient returns to practice for follow-up. He was seen approximately 1 year ago regarding an umbilical hernia and an epidermal inclusion cyst on the left shoulder. Due to a variety of circumstances the patient has postponed having these procedures. He now would like to proceed with umbilical hernia repair. He feels that the hernia has become larger. He has intermittent episodes of discomfort. He has had no signs or symptoms of obstruction. Patient also would like to have the epidermal inclusion cyst excised from the left shoulder.   Procedure Details  The patient was brought to operating room and placed in a supine position on the operating room table.  Following induction of general anesthesia, the patient was prepped and draped in the usual aseptic fashion.  After ascertaining that an adequate level of anesthesia been achieved, an infraumbilical incision was made transversely with a #15 blade.  Dissection was carried through the subcutaneous tissues and the hernia sac was identified.  Hernia sac was dissected off the posterior aspect of the umbilical skin and the entire sac was dissected out down to the fascial defect.  Umbilicus was completely elevated off of the abdominal wall.  Hernia sac was excised and the hernia was reduced.  Preperitoneal space was developed with gentle blunt dissection.  A Ventralex ST 4.3 cm mesh patch was selected for the repair.  The mesh patch was placed into the preperitoneal space and deployed  circumferentially.  It was secured to the overlying fascia with the closure of the fascia with interrupted 0-Novafil simple sutures.  Local field block was placed with Marcaine.  Umbilicus was affixed to the abdominal wall with an interrupted 0-Novofil suture.  Subcutaneous tissues were closed with interrupted 3-0 Vicryl sutures.  Skin was anesthetized with local anesthetic.  Skin edges were approximated with a running 4-0 Monocryl subcuticular suture.  Wound was washed and dried and Dermabond applied.    Patient was then turned to a right lateral decubitus position.  The surgical site on the left posterior shoulder representing an epidermal inclusion cyst had been previously marked.  Site was prepped and draped in the usual aseptic fashion.  Using a #15 blade an elliptical incision was made so as to encompass the sinus tract to the skin.  Dissection was carried sharply into the subcutaneous tissues.  The entire cyst was excised.  It measured 1 cm in greatest dimension.  It was passed off the field and discarded.  Hemostasis was achieved with the electrocautery.  Site was anesthetized with local anesthetic.  Skin edges were reapproximated with interrupted 4-0 Monocryl subcuticular sutures.  Wound was washed and dried and Dermabond was applied as dressing.  The patient was awakened from anesthesia and brought to the recovery room.  The patient tolerated the procedure well.   Darnell Level, MD [facility] Office: [phone]

## 2022-12-20 NOTE — Discharge Instructions (Addendum)
[facility]  HERNIA REPAIR POST OP INSTRUCTIONS  Always review your discharge instruction sheet given to you by the facility where your surgery was performed.  A  prescription for pain medication may be sent to your pharmacy on discharge.  Take your pain medication as prescribed.  If narcotic pain medicine is not needed, then you may take acetaminophen (Tylenol) or ibuprofen (Advil) as needed.  Take your usually prescribed medications unless otherwise directed.  If you need a refill on your pain medication, please contact your pharmacy.  They will contact our office to request authorization. Prescriptions will not be filled after 5:00 PM daily or on weekends.  You should follow a light diet the first 24 hours after arrival home, such as soup and crackers or toast.  Be sure to include plenty of fluids daily.  Resume your normal diet the day after surgery.  Most patients will experience some swelling and bruising around the surgical site.  Ice packs and reclining will help.  Swelling and bruising can take several days to resolve.   It is common to experience some constipation if taking pain medication after surgery.  Increasing fluid intake and taking a stool softener (such as Colace) will usually help or prevent this problem from occurring.  A mild laxative (Milk of Magnesia or Miralax) should be taken according to package directions if there is no bowel movement after 48 hours.  You will likely have Dermabond (topical glue) over your incisions.  This seals the incisions and allows you to bathe and shower at any time after your surgery.  Glue should remain in place for up to 10 days.  It may be removed after 10 days by pealing off the Dermabond material or using Vaseline or naval jelly to remove.  ACTIVITIES:  You may resume regular (light) daily activities beginning the next day - such as daily self-care, walking, climbing stairs - gradually increasing activities as tolerated.  You  may have sexual intercourse when it is comfortable.  Refrain from any heavy lifting or straining until approved by your doctor.  You may drive when you are no longer taking prescription pain medication, when you can comfortably wear a seatbelt, and when you can safely maneuver your car and apply the brakes.  You should see your doctor in the office for a follow-up appointment approximately 2-3 weeks after your surgery.  Make sure that you call for this appointment within a day or two after you arrive home to insure a convenient appointment time.  WHEN TO CALL YOUR DOCTOR: Fever greater than 101.0 Inability to urinate Persistent nausea and/or vomiting Extreme swelling or bruising Continued bleeding from incision Increased pain, redness, or drainage from the incision  The clinic staff is available to answer your questions during regular business hours.  Please don't hesitate to call and ask to speak to one of the nurses for clinical concerns.  If you have a medical emergency, go to the nearest emergency room or call 911.  A surgeon from [facility] is always on call for the hospital.   [facility] [address], [place], [place]  [address]  [phone] ? [phone] ? FAX [phone] Post Anesthesia Home Care Instructions  Activity: Get plenty of rest for the remainder of the day. A responsible individual must stay with you for 24 hours following the procedure.  For the next 24 hours, DO NOT: -Drive a car -Advertising copywriter -Drink alcoholic beverages -Take any medication unless  instructed by your physician -Make any legal decisions or sign important papers.  Meals: Start with liquid foods such as gelatin or soup. Progress to regular foods as tolerated. Avoid greasy, spicy, heavy foods. If nausea and/or vomiting occur, drink only clear liquids until the nausea and/or vomiting subsides. Call your physician if vomiting continues.  Special  Instructions/Symptoms: Your throat may feel dry or sore from the anesthesia or the breathing tube placed in your throat during surgery. If this causes discomfort, gargle with warm salt water. The discomfort should disappear within 24 hours.  No tylenol until 12:00 p.m.

## 2022-12-20 NOTE — Transfer of Care (Signed)
Immediate Anesthesia Transfer of Care Note  Patient: Maurice [name]  Procedure(s) Performed: OPEN HERNIA REPAIR UMBILICAL WITH MESH (Abdomen) EXCISION EPIDERMAL CYST REMOVAL LEFT SHOULDER (Left: Shoulder)  Patient Location: PACU  Anesthesia Type:General  Level of Consciousness: awake, alert , and oriented  Airway & Oxygen Therapy: Patient Spontanous Breathing  Post-op Assessment: Report given to RN and Post -op Vital signs reviewed and stable  Post vital signs: Reviewed and stable  Last Vitals:  Vitals Value Taken Time  BP 137/83 12/20/22 0851  Temp    Pulse 78 12/20/22 0854  Resp 11 12/20/22 0854  SpO2 96 % 12/20/22 0854  Vitals shown include unfiled device data.  Last Pain:  Vitals:   12/20/22 0559  TempSrc: Oral  PainSc: 0-No pain      Patients Stated Pain Goal: 5 (12/20/22 0559)  Complications: No notable events documented.

## 2022-12-21 NOTE — Anesthesia Postprocedure Evaluation (Signed)
Anesthesia Post Note  Patient: Balen Woolum  Procedure(s) Performed: OPEN HERNIA REPAIR UMBILICAL WITH MESH (Abdomen) EXCISION EPIDERMAL CYST REMOVAL LEFT SHOULDER (Left: Shoulder)     Patient location during evaluation: PACU Anesthesia Type: General Level of consciousness: sedated and patient cooperative Pain management: pain level controlled Vital Signs Assessment: post-procedure vital signs reviewed and stable Respiratory status: spontaneous breathing Cardiovascular status: stable Anesthetic complications: no   No notable events documented.  Last Vitals:  Vitals:   12/20/22 0930 12/20/22 1000  BP: 125/88 128/87  Pulse: 72 78  Resp: (!) 9 16  Temp: (!) 36.4 C   SpO2: 96% 97%    Last Pain:  Vitals:   12/20/22 1000  TempSrc:   PainSc: 0-No pain                 Lewie Loron

## 2022-12-22 ENCOUNTER — Encounter (HOSPITAL_BASED_OUTPATIENT_CLINIC_OR_DEPARTMENT_OTHER): Payer: Self-pay | Admitting: Surgery

## 2023-01-18 ENCOUNTER — Encounter: Payer: Self-pay | Admitting: Allergy

## 2023-01-18 ENCOUNTER — Ambulatory Visit (INDEPENDENT_AMBULATORY_CARE_PROVIDER_SITE_OTHER): Payer: Federal, State, Local not specified - PPO | Admitting: Allergy

## 2023-01-18 VITALS — BP 130/78 | HR 74 | Temp 97.9°F | Resp 16 | Ht 69.25 in | Wt 193.5 lb

## 2023-01-18 DIAGNOSIS — J328 Other chronic sinusitis: Secondary | ICD-10-CM

## 2023-01-18 MED ORDER — XHANCE 93 MCG/ACT NA EXHU: INHALANT_SUSPENSION | NASAL | 5 refills | Status: DC

## 2023-01-18 NOTE — Progress Notes (Signed)
New Patient Note  RE: Maurice [name] MRN: [id number] DOB: 1951/01/13 Date of Office Visit: 01/18/2023  Consult requested by: [name], MD Primary care provider: [name], MD  Chief Complaint: Nasal Congestion  History of Present Illness: I had the pleasure of seeing Maurice [name] for initial evaluation at the [facility] on 01/18/2023. He is a 72 y.o. male, who is self-referred here for the evaluation of nasal congestion.  Discussed the use of AI scribe software for clinical note transcription with the patient, who gave verbal consent to proceed.  The patient presents with a chief complaint of persistent nasal stuffiness, particularly at night. He reports waking up after approximately three and a half hours of sleep due to difficulty breathing. The patient has been using Afrin (oxymetazoline) nasal spray for symptom relief, but recently, the effectiveness has decreased despite increasing the dosage to four or five sprays per night.  Previously, the patient was prescribed Flonase (fluticasone) and azelastine, which were later switched to Xhance (fluticasone). However, the patient discontinued these medications about two years ago due to perceived lack of effectiveness. Despite this, he acknowledges that his nasal congestion was not as severe when using these medications compared to his current state.  The patient denies any associated headaches, itchy nose, runny nose, or frequent sneezing. He reports occasional post-nasal drip but does not consider it a significant issue. He has no history of asthma, food allergies, medication allergies, or bee sting allergies. He was hospitalized for nine days in January due to RSV infection.   The patient's current medication regimen includes Flomax (tamsulosin) for urinary symptoms and Afrin nasal spray for nasal congestion. He lives in a house with a cat and a dog, and denies any known water or mold damage in his home.   No  recent ENT evaluation.  Assessment and Plan: Maurice [name] is a 72 y.o. male with: Other chronic sinusitis Chronic nasal congestion, primarily nocturnal. Overuse of Afrin (oxymetazoline) leading to rebound congestion. Previous use of Flonase (fluticasone) and azelastine with partial relief. Discontinued Xhance (fluticasone) approximately 2 years ago. 2015 skin testing positive to some molds. No recent ENT evaluation or previous sinus surgery.  Start Xhance (fluticasone) nasal spray 2 sprays per nostril twice a day as needed for nasal congestion.  If this is not covered let us know.  While you wait to get [facility] Ryaltris (olopatadine + mometasone nasal spray combination) 1-2 sprays per nostril twice a day. Sample given. Nasal saline spray (i.e., Simply Saline) or nasal saline lavage (i.e., NeilMed) is recommended as needed and prior to medicated nasal sprays. If this works well for you let me know and I'll send in a prescription for this. Wean off Afrin - try to use 1 less spray per nostril per week until you completely are weaned off. If no improvement will recommend ENT evaluation and repeat skin testing next.  Return in about 2 months (around 03/20/2023).  Meds ordered this encounter  Medications   Fluticasone Propionate (XHANCE) 93 MCG/ACT EXHU    Sig: 1-2 sprays per nostril twice a day.    Dispense:  32 mL    Refill:  5   Lab Orders  No laboratory test(s) ordered today    Other allergy screening: Asthma: no Food allergy: no Medication allergy: no Hymenoptera allergy: no Urticaria: no Eczema:no History of recurrent infections suggestive of immunodeficency: no  Diagnostics: None.   Past Medical History: Patient Active Problem List   Diagnosis Date Noted  Umbilical hernia 12/17/2022   Epidermal cyst 12/17/2022   Nasal ulcer 03/18/2022   Transaminitis 03/16/2022   Severe sepsis (HCC) 03/11/2022   CAP (community acquired pneumonia) 03/11/2022   Wheeze 03/11/2022    Hypoxia 03/11/2022   RSV (respiratory syncytial virus pneumonia) 03/10/2022   Prostate cancer (HCC) 01/06/2022   Nasal congestion 05/03/2018   Palpitations 05/27/2016   Past Medical History:  Diagnosis Date   Allergic rhinitis    Allergy    Arthritis    GERD (gastroesophageal reflux disease)    Plantar fasciitis, left    Pneumonia    in hospital x 9 days pneumonia, rsv and sepsis   Prostate cancer (HCC) 2024   s/p radioactive seed implant march 2024   Past Surgical History: Past Surgical History:  Procedure Laterality Date   APPENDECTOMY  1964   CATARACT EXTRACTION  05/12/2022   CATARACT EXTRACTION  02/2022   CYST EXCISION Left 12/20/2022   Procedure: EXCISION EPIDERMAL CYST REMOVAL LEFT SHOULDER;  Surgeon: Darnell Level, MD;  Location: [facility] [employer];  Service: General;  Laterality: Left;   RADIOACTIVE SEED IMPLANT N/A 06/03/2022   Procedure: RADIOACTIVE SEED IMPLANT/BRACHYTHERAPY IMPLANT;  Surgeon: Sebastian Ache, MD;  Location: [facility];  Service: Urology;  Laterality: N/A;  90 MINS   SPACE OAR INSTILLATION N/A 06/03/2022   Procedure: SPACE OAR INSTILLATION;  Surgeon: Sebastian Ache, MD;  Location: [facility];  Service: Urology;  Laterality: N/A;   TONSILLECTOMY  1956   UMBILICAL HERNIA REPAIR N/A 12/20/2022   Procedure: OPEN HERNIA REPAIR UMBILICAL WITH MESH;  Surgeon: Darnell Level, MD;  Location: [facility] ;  Service: General;  Laterality: N/A;   Medication List:  Current Outpatient Medications  Medication Sig Dispense Refill   Fluticasone Propionate (XHANCE) 93 MCG/ACT EXHU 1-2 sprays per nostril twice a day. 32 mL 5   oxymetazoline (AFRIN) 0.05 % nasal spray Place 1-5 sprays into both nostrils at bedtime as needed for congestion.     sildenafil (VIAGRA) 100 MG tablet Take 100 mg by mouth daily as needed for erectile dysfunction.     tamsulosin (FLOMAX) 0.4 MG CAPS capsule Take 1 capsule (0.4 mg total) by  mouth 2 (two) times daily after a meal. (Patient taking differently: Take 0.4 mg by mouth daily after breakfast.) 60 capsule 1   No current facility-administered medications for this visit.   Allergies: No Known Allergies Social History: Social History   Socioeconomic History   Marital status: Married    Spouse name: Not on file   Number of children: Not on file   Years of education: Not on file   Highest education level: Not on file  Occupational History   Not on file  Tobacco Use   Smoking status: Never   Smokeless tobacco: Never  Vaping Use   Vaping status: Never Used  Substance and Sexual Activity   Alcohol use: Not Currently    Comment: 2-3 beers a night   Drug use: No   Sexual activity: Not on file  Other Topics Concern   Not on file  Social History Narrative   Not on file   Social Determinants of Health   Financial Resource Strain: Not on file  Food Insecurity: No Food Insecurity (03/19/2022)   Hunger Vital Sign    Worried About Running Out of Food in the Last Year: Never true    Ran Out of Food in the Last Year: Never true  Transportation Needs: No Transportation Needs (03/19/2022)  PRAPARE - Administrator, Civil Service (Medical): No    Lack of Transportation (Non-Medical): No  Physical Activity: Not on file  Stress: Not on file  Social Connections: Not on file   Lives in a house. Smoking: denies Occupation: retired  Landscape architect HistorySurveyor, minerals in the house: no Engineer, civil (consulting) in the family room: no Carpet in the bedroom: yes Heating: gas Cooling: central Pet: yes 1 cat x 5 yrs, 1 dog x 10 yrs  Family History: Family History  Problem Relation Age of Onset   Arthritis Father    Cancer Father    Allergic rhinitis Father    Asthma Father    Review of Systems  Constitutional:  Negative for appetite change, chills, fever and unexpected weight change.  HENT:  Positive for congestion. Negative for rhinorrhea.   Eyes:  Negative  for itching.  Respiratory:  Negative for cough, chest tightness, shortness of breath and wheezing.   Cardiovascular:  Negative for chest pain.  Gastrointestinal:  Negative for abdominal pain.  Genitourinary:  Negative for difficulty urinating.  Skin:  Negative for rash.  Neurological:  Negative for headaches.    Objective: BP 130/78   Pulse 74   Temp 97.9 F (36.6 C)   Resp 16   Ht 5' 9.25" (1.759 m)   Wt 193 lb 8 oz (87.8 kg)   SpO2 98%   BMI 28.37 kg/m  Body mass index is 28.37 kg/m. Physical Exam Vitals and nursing note reviewed.  Constitutional:      Appearance: Normal appearance. He is well-developed.  HENT:     Head: Normocephalic and atraumatic.     Right Ear: Tympanic membrane and external ear normal.     Left Ear: Tympanic membrane and external ear normal.     Nose: Congestion and rhinorrhea present.     Mouth/Throat:     Mouth: Mucous membranes are moist.     Pharynx: Oropharynx is clear.  Eyes:     Conjunctiva/sclera: Conjunctivae normal.  Cardiovascular:     Rate and Rhythm: Normal rate and regular rhythm.     Heart sounds: Normal heart sounds. No murmur heard.    No friction rub. No gallop.  Pulmonary:     Effort: Pulmonary effort is normal.     Breath sounds: Normal breath sounds. No wheezing, rhonchi or rales.  Musculoskeletal:     Cervical back: Neck supple.  Skin:    General: Skin is warm.     Findings: No rash.  Neurological:     Mental Status: He is alert and oriented to person, place, and time.  Psychiatric:        Behavior: Behavior normal.    The plan was reviewed with the patient/family, and all questions/concerned were addressed.  It was my pleasure to see Maurice [name] today and participate in his care. Please feel free to contact me with any questions or concerns.  Sincerely,  Wyline Mood, DO Allergy & Immunology  [facility] office: [phone] [facility] office: [phone]

## 2023-01-18 NOTE — Patient Instructions (Signed)
Chronic sinusitis Start Xhance (fluticasone) nasal spray 2 sprays per nostril twice a day as needed for nasal congestion.  If this is not covered let us know.  Sent to [facility] pharmacy. Unfortunately I don't have any samples of this. While you wait to get [facility] Ryaltris (olopatadine + mometasone nasal spray combination) 1-2 sprays per nostril twice a day. Sample given. Nasal saline spray (i.e., Simply Saline) or nasal saline lavage (i.e., NeilMed) is recommended as needed and prior to medicated nasal sprays. If this works well for you let me know and I'll send in a prescription for this for you.  Wean off Afrin - try to use 1 less spray per nostril per week until you completely are weaned off.  Follow up in 2 months. If no improvement will recommend ENT evaluation and repeat skin testing next.

## 2023-01-20 ENCOUNTER — Other Ambulatory Visit (HOSPITAL_COMMUNITY): Payer: Self-pay

## 2023-01-25 ENCOUNTER — Ambulatory Visit: Payer: Federal, State, Local not specified - PPO | Admitting: Allergy

## 2023-02-14 ENCOUNTER — Telehealth: Payer: Self-pay | Admitting: Allergy

## 2023-02-14 MED ORDER — RYALTRIS 665-25 MCG/ACT NA SUSP
1.0000 | Freq: Two times a day (BID) | NASAL | 5 refills | Status: AC
Start: 2023-02-14 — End: ?

## 2023-02-14 NOTE — Telephone Encounter (Signed)
Is it okay to send rx for ryaltris. Per last AVS he was only supposed to use the sample meanwhile he waits to get the xhance.

## 2023-02-14 NOTE — Telephone Encounter (Signed)
Pt states the ryaltris worked will and he would like a prescription called in for it,  he would like it to go to [facility]- [address]

## 2023-02-14 NOTE — Telephone Encounter (Signed)
I sent in rx. It goes to [facility] - they will contact him about delivery.

## 2023-03-08 ENCOUNTER — Telehealth: Payer: Self-pay | Admitting: Allergy

## 2023-03-08 DIAGNOSIS — J328 Other chronic sinusitis: Secondary | ICD-10-CM

## 2023-03-08 MED ORDER — XHANCE 93 MCG/ACT NA EXHU: INHALANT_SUSPENSION | NASAL | 5 refills | Status: DC

## 2023-03-08 NOTE — Telephone Encounter (Signed)
I called patient and informed of message. Patient is very thankful as Dr.Kim acted quickly in regards to letter. I informed patient to call the office back if needed.

## 2023-03-08 NOTE — Telephone Encounter (Signed)
 Please call patient.   I received the letter.  The Maurice [name] goes to a mail order pharmacy: [facility].  He can call [phone].   I sent the prescription again today to see if his insurance will approve it.

## 2023-03-11 ENCOUNTER — Other Ambulatory Visit (HOSPITAL_COMMUNITY): Payer: Self-pay

## 2023-03-23 NOTE — Progress Notes (Signed)
Follow Up Note  RE: Maurice [name] MRN: [id number] DOB: January 18, 1951 Date of Office Visit: 03/24/2023  Referring provider: [name], MD Primary care provider: [name], MD  Chief Complaint: No chief complaint on file.  History of Present Illness: I had the pleasure of seeing Maurice [name] for a follow up visit at the [facility] on 03/24/2023. He is a 73 y.o. male, who is being followed for chronic sinusitis. His previous allergy office visit was on 01/18/2023 with Dr. Selena Batten. Today is a regular follow up visit.  Discussed the use of AI scribe software for clinical note transcription with the patient, who gave verbal consent to proceed.  The patient, with a history of chronic sinusitis, presents with persistent nasal congestion despite the use of two different nasal sprays. He initially used [place] for a couple of weeks, applying two sprays per nostril twice a day, but reported minimal improvement. He then switched to Ryaltris, also applying two sprays per nostril twice a day, and noted a slight improvement, rating it a three out of ten.  The patient describes his nasal congestion as being worse at night, often waking up three hours after falling asleep due to severe nasal blockage. This has been a daily occurrence, disrupting his sleep cycle. He also reports occasional use of Afrin, particularly on nights when the congestion is severe, but has been trying to reduce its usage.  In addition to the nasal sprays, the patient has been using a saline spray for about a week to help alleviate the congestion. He also reports a recent history of a root canal due to a gum infection, which he believes may have contributed to some fluid in his ear. However, this is not currently causing him any discomfort.      Assessment and Plan: Maurice [name] is a 73 y.o. male with: Other chronic sinusitis Past history - chronic nasal congestion, primarily nocturnal. Overuse of Afrin (oxymetazoline)  leading to rebound congestion. Previous use of Flonase (fluticasone) and azelastine with partial relief. Discontinued Xhance (fluticasone) approximately 2 years ago. 2015 skin testing positive to some molds. No recent ENT evaluation or previous sinus surgery.  Interim history - Ryaltris more effective than Xhance but only noted 30% improvement in symptoms. Still waking up at night. Less Afrin use. Wants to monitor symptoms better for 1 month before seeing ENT and/or re-testing.  Continue Ryaltris (olopatadine + mometasone nasal spray combination) 1-2 sprays per nostril twice a day. Demonstrated proper nasal spray use.  Use a saline lavage as below at night before going to bed.  Wean off Afrin - try to use 1 less spray per nostril per week until you completely are weaned off. Use a humidifier at night. Follow up in 1 months. If no improvement will recommend ENT evaluation and repeat skin testing next. Patient will explore possible sleep test due to waking up nightly at 1AM.   Return in about 4 weeks (around 04/21/2023).  No orders of the defined types were placed in this encounter.  Lab Orders  No laboratory test(s) ordered today    Diagnostics: None.   Medication List:  Current Outpatient Medications  Medication Sig Dispense Refill   amoxicillin (AMOXIL) 500 MG capsule Take 500 mg by mouth 3 (three) times daily.     Olopatadine-Mometasone (RYALTRIS) [id number] MCG/ACT SUSP Place 1-2 sprays into the nose in the morning and at bedtime. 29 g 5   oxymetazoline (AFRIN) 0.05 % nasal spray Place 1-5 sprays into both nostrils  at bedtime as needed for congestion.     sildenafil (VIAGRA) 100 MG tablet Take 100 mg by mouth daily as needed for erectile dysfunction.     tamsulosin (FLOMAX) 0.4 MG CAPS capsule Take 1 capsule (0.4 mg total) by mouth 2 (two) times daily after a meal. (Patient taking differently: Take 0.4 mg by mouth daily after breakfast.) 60 capsule 1   No current facility-administered  medications for this visit.   Allergies: No Known Allergies I reviewed his past medical history, social history, family history, and environmental history and no significant changes have been reported from his previous visit.  Review of Systems  Constitutional:  Negative for appetite change, chills, fever and unexpected weight change.  HENT:  Positive for congestion. Negative for rhinorrhea.   Eyes:  Negative for itching.  Respiratory:  Negative for cough, chest tightness, shortness of breath and wheezing.   Cardiovascular:  Negative for chest pain.  Gastrointestinal:  Negative for abdominal pain.  Genitourinary:  Negative for difficulty urinating.  Skin:  Negative for rash.  Neurological:  Negative for headaches.    Objective: BP 120/82 (BP Location: Right Arm, Patient Position: Sitting, Cuff Size: Large)   Pulse 83   Temp 98.1 F (36.7 C) (Temporal)   Resp 16   SpO2 96%  There is no height or weight on file to calculate BMI. Physical Exam Vitals and nursing note reviewed.  Constitutional:      Appearance: Normal appearance. He is well-developed.  HENT:     Head: Normocephalic and atraumatic.     Right Ear: Tympanic membrane and external ear normal.     Left Ear: Tympanic membrane and external ear normal.     Nose:     Comments: Some dried blood and irritation noted on nasal septum b/l.    Mouth/Throat:     Mouth: Mucous membranes are moist.     Pharynx: Oropharynx is clear.  Eyes:     Conjunctiva/sclera: Conjunctivae normal.  Cardiovascular:     Rate and Rhythm: Normal rate and regular rhythm.     Heart sounds: Normal heart sounds. No murmur heard.    No friction rub. No gallop.  Pulmonary:     Effort: Pulmonary effort is normal.     Breath sounds: Normal breath sounds. No wheezing, rhonchi or rales.  Musculoskeletal:     Cervical back: Neck supple.  Skin:    General: Skin is warm.     Findings: No rash.  Neurological:     Mental Status: He is alert and  oriented to person, place, and time.  Psychiatric:        Behavior: Behavior normal.    Previous notes and tests were reviewed. The plan was reviewed with the patient/family, and all questions/concerned were addressed.  It was my pleasure to see Maurice [name] today and participate in his care. Please feel free to contact me with any questions or concerns.  Sincerely,  Wyline Mood, DO Allergy & Immunology  [facility] office: [phone] [facility] office: [phone]

## 2023-03-24 ENCOUNTER — Encounter: Payer: Self-pay | Admitting: Allergy

## 2023-03-24 ENCOUNTER — Ambulatory Visit (INDEPENDENT_AMBULATORY_CARE_PROVIDER_SITE_OTHER): Payer: Federal, State, Local not specified - PPO | Admitting: Allergy

## 2023-03-24 VITALS — BP 120/82 | HR 83 | Temp 98.1°F | Resp 16

## 2023-03-24 DIAGNOSIS — J328 Other chronic sinusitis: Secondary | ICD-10-CM | POA: Diagnosis not present

## 2023-03-24 NOTE — Patient Instructions (Addendum)
Chronic sinusitis Continue Ryaltris (olopatadine + mometasone nasal spray combination) 1-2 sprays per nostril twice a day. Use a saline lavage as below at night before going to bed.   Wean off Afrin - try to use 1 less spray per nostril per week until you completely are weaned off.  Use a humidifier at night.  Follow up in 1 months. If no improvement will recommend ENT evaluation and repeat skin testing next.   Buffered Isotonic Saline Irrigations:  Goal: When you irrigate with the isotonic saline (salt water) it washes mucous and other debris from your nose that could be contributing to your nasal symptoms.   Recipe: Obtain 1 quart jar that is clean Fill with clean (bottled, boiled or distilled) water Add 1-2 heaping teaspoons of salt without iodine If the solution with 2 teaspoons of salt is too strong, adjust the amount down until better tolerated Add 1 teaspoon of [employer] baking soda (pure bicarbonate) Mix ingredients together and store at room temperature and discard after 1 week * Alternatively you can buy pre made salt packets for the [employer] bottle or there          are other over the counter brands available  Instructions: Warm  cup of the solution in the microwave if desired but be careful not to overheat as this will burn the inside of your nose Stand over a sink (or do it while you shower) and squirt the solution into one side of your nose aiming towards the back of your head Sometimes saying "coca cola" while irrigating can be helpful to prevent fluid from going down your throat  The solution will travel to the back of your nose and then come out the other side Perform this again on the other side Try to do this twice a day If you are using a nasal spray in addition to the irrigation, irrigate first and then use the topical nasal spray otherwise you will wash the nasal spray out of your nose

## 2023-04-21 ENCOUNTER — Ambulatory Visit: Payer: Federal, State, Local not specified - PPO | Admitting: Allergy
